# Patient Record
Sex: Female | Born: 1985
Health system: Southern US, Community
[De-identification: ages and names within clinical notes are randomized; demographics above are authoritative.]

## PROBLEM LIST (undated history)

## (undated) ENCOUNTER — Inpatient Hospital Stay: Payer: Self-pay

## (undated) DIAGNOSIS — F419 Anxiety disorder, unspecified: Secondary | ICD-10-CM

## (undated) DIAGNOSIS — F32A Depression, unspecified: Secondary | ICD-10-CM

## (undated) DIAGNOSIS — F329 Major depressive disorder, single episode, unspecified: Secondary | ICD-10-CM

## (undated) HISTORY — DX: Major depressive disorder, single episode, unspecified: F32.9

## (undated) HISTORY — DX: Anxiety disorder, unspecified: F41.9

## (undated) HISTORY — DX: Depression, unspecified: F32.A

## (undated) HISTORY — PX: WISDOM TOOTH EXTRACTION: SHX21

---

## 2008-04-30 ENCOUNTER — Observation Stay: Payer: Self-pay | Admitting: Obstetrics and Gynecology

## 2008-07-11 ENCOUNTER — Observation Stay: Payer: Self-pay

## 2008-07-13 ENCOUNTER — Inpatient Hospital Stay: Payer: Self-pay | Admitting: Obstetrics and Gynecology

## 2009-08-08 ENCOUNTER — Emergency Department: Payer: Self-pay | Admitting: Emergency Medicine

## 2010-12-25 ENCOUNTER — Emergency Department: Payer: Self-pay | Admitting: Emergency Medicine

## 2011-02-20 ENCOUNTER — Emergency Department: Payer: Self-pay | Admitting: Emergency Medicine

## 2011-03-22 ENCOUNTER — Ambulatory Visit: Payer: Self-pay

## 2012-12-10 ENCOUNTER — Emergency Department: Payer: Self-pay | Admitting: Unknown Physician Specialty

## 2012-12-10 LAB — BASIC METABOLIC PANEL
Anion Gap: 7 (ref 7–16)
BUN: 10 mg/dL (ref 7–18)
Chloride: 107 mmol/L (ref 98–107)
Co2: 26 mmol/L (ref 21–32)
EGFR (African American): >60
Glucose: 76 mg/dL (ref 65–99)
Osmolality: 277 (ref 275–301)
Potassium: 3.8 mmol/L (ref 3.5–5.1)
Sodium: 140 mmol/L (ref 136–145)

## 2012-12-10 LAB — CBC
HCT: 38 % (ref 35.0–47.0)
HGB: 13.2 g/dL (ref 12.0–16.0)
MCHC: 34.7 g/dL (ref 32.0–36.0)
RDW: 13.2 % (ref 11.5–14.5)

## 2012-12-10 LAB — TROPONIN I: Troponin-I: <0.02 ng/mL

## 2012-12-10 LAB — TSH: Thyroid Stimulating Horm: 0.876 u[IU]/mL

## 2014-05-13 ENCOUNTER — Emergency Department: Payer: Self-pay | Admitting: Emergency Medicine

## 2014-05-13 LAB — BASIC METABOLIC PANEL
Anion Gap: 7 (ref 7–16)
BUN: 8 mg/dL (ref 7–18)
CHLORIDE: 102 mmol/L (ref 98–107)
Calcium, Total: 8.5 mg/dL (ref 8.5–10.1)
Co2: 26 mmol/L (ref 21–32)
Creatinine: 0.79 mg/dL (ref 0.60–1.30)
EGFR (Non-African Amer.): >60
Glucose: 93 mg/dL (ref 65–99)
OSMOLALITY: 268 (ref 275–301)
POTASSIUM: 3.5 mmol/L (ref 3.5–5.1)
Sodium: 135 mmol/L — ABNORMAL LOW (ref 136–145)

## 2014-05-13 LAB — CBC WITH DIFFERENTIAL/PLATELET
BASOS ABS: 0 10*3/uL (ref 0.0–0.1)
Basophil %: 0.2 %
Eosinophil #: 0 10*3/uL (ref 0.0–0.7)
Eosinophil %: 0 %
HCT: 41.6 % (ref 35.0–47.0)
HGB: 13.7 g/dL (ref 12.0–16.0)
LYMPHS ABS: 1 10*3/uL (ref 1.0–3.6)
LYMPHS PCT: 6 %
MCH: 30.6 pg (ref 26.0–34.0)
MCHC: 33 g/dL (ref 32.0–36.0)
MCV: 93 fL (ref 80–100)
Monocyte #: 0.9 x10 3/mm (ref 0.2–0.9)
Monocyte %: 4.9 %
Neutrophil #: 15.5 10*3/uL — ABNORMAL HIGH (ref 1.4–6.5)
Neutrophil %: 88.9 %
PLATELETS: 232 10*3/uL (ref 150–440)
RBC: 4.49 10*6/uL (ref 3.80–5.20)
RDW: 13.9 % (ref 11.5–14.5)
WBC: 17.4 10*3/uL — AB (ref 3.6–11.0)

## 2014-05-13 LAB — ED INFLUENZA
H1N1 flu by pcr: NOT DETECTED
INFLAPCR: NEGATIVE
Influenza B By PCR: NEGATIVE

## 2014-05-15 LAB — BETA STREP CULTURE(ARMC)

## 2014-06-05 NOTE — L&D Delivery Note (Signed)
Delivery Note At 1:42 AM a viable female was delivered via Vaginal, Spontaneous Delivery (Presentation: LOA).  APGAR: , ; weight  .   Placenta status: Intact, Spontaneous.  Cord: 3 vessels, with the following complications: none apparent.  Cord pH: not collected  Anesthesia: Epidural  Episiotomy: None Lacerations:  none Suture Repair: none Est. Blood Loss (mL):  Zero (really)  Mom to postpartum.  Baby to Couplet care / Skin to Skin.  Called to room and fetal head was at the perineum.  Controlled pushing propelled baby through crowning and delivery.  Baby was immediately placed on mom's chest and attended to by pediatric team.  Cord clamping was delayed for 60sec and then cord was doubly clamped and FOB cut the cord.  Placenta then spontaneously delivered.  There was no blood loss during delivery, and minimal placental blood.  No lacerations/repairs.  We sang Happy [place]day to [place] while he and mom bonded skin-to-skin.  Maely Clements C 04/19/2015, 2:03 AM

## 2014-09-02 LAB — OB RESULTS CONSOLE VARICELLA ZOSTER ANTIBODY, IGG: VARICELLA IGG: IMMUNE

## 2014-09-02 LAB — OB RESULTS CONSOLE RUBELLA ANTIBODY, IGM: RUBELLA: IMMUNE

## 2014-09-02 LAB — OB RESULTS CONSOLE HIV ANTIBODY (ROUTINE TESTING): HIV: NONREACTIVE

## 2014-09-02 LAB — OB RESULTS CONSOLE ANTIBODY SCREEN: ANTIBODY SCREEN: NEGATIVE

## 2014-09-02 LAB — OB RESULTS CONSOLE HEPATITIS B SURFACE ANTIGEN: Hepatitis B Surface Ag: NEGATIVE

## 2014-11-24 ENCOUNTER — Encounter: Payer: Self-pay | Admitting: Emergency Medicine

## 2014-11-24 ENCOUNTER — Emergency Department
Admission: EM | Admit: 2014-11-24 | Discharge: 2014-11-24 | Disposition: A | Discharge status: Home / Self Care | Payer: 59 | Attending: Emergency Medicine | Admitting: Emergency Medicine

## 2014-11-24 ENCOUNTER — Emergency Department: Payer: 59

## 2014-11-24 DIAGNOSIS — O200 Threatened abortion: Secondary | ICD-10-CM | POA: Diagnosis not present

## 2014-11-24 DIAGNOSIS — Z3A18 18 weeks gestation of pregnancy: Secondary | ICD-10-CM | POA: Diagnosis not present

## 2014-11-24 DIAGNOSIS — O209 Hemorrhage in early pregnancy, unspecified: Secondary | ICD-10-CM | POA: Diagnosis present

## 2014-11-24 LAB — ABO/RH
ABO/RH(D): A POS
ABO/RH(D): A POS

## 2014-11-24 LAB — HCG, QUANTITATIVE, PREGNANCY: HCG, BETA CHAIN, QUANT, S: 23286 m[IU]/mL — AB (ref <5)

## 2014-11-24 LAB — CBC
HCT: 36.8 % (ref 35.0–47.0)
HEMOGLOBIN: 12.1 g/dL (ref 12.0–16.0)
MCH: 29.6 pg (ref 26.0–34.0)
MCHC: 33 g/dL (ref 32.0–36.0)
MCV: 89.8 fL (ref 80.0–100.0)
Platelets: 245 10*3/uL (ref 150–440)
RBC: 4.1 MIL/uL (ref 3.80–5.20)
RDW: 14.5 % (ref 11.5–14.5)
WBC: 11.9 10*3/uL — ABNORMAL HIGH (ref 3.6–11.0)

## 2014-11-24 NOTE — ED Notes (Signed)
Pt to ed with c/o large amount of vaginal bleeding,  Pt states she is approx 18 weeks OB.  Pt reports mild cramping assoc with bleeding.

## 2014-11-24 NOTE — ED Provider Notes (Signed)
[facility] Emergency Department Provider Note  Time seen: 2:32 PM  I have reviewed the triage vital signs and the nursing notes.   HISTORY  Chief Complaint Vaginal Bleeding    HPI Brenda Cohen is a 29 y.o. female with no known medical problems, currently [redacted] weeks pregnant presents the emergency department with some left-sided abdominal pain, and vaginal bleeding. According to the patient she was seen by her OB this morning, had a mild amount of left-sided abdominal pain at that time. She states after leaving the OB she was attempting to have a bowel movement and she was straining, she looked in the toilet and noticed blood in the toilet. Patient states she is sure it is vaginal bleeding and not rectal bleeding. This is the patient's third pregnancy. She states mild to moderate left-sided abdominal pain currently, she says the bleeding has largely resolved at this time. Somewhat worse with movement.     History reviewed. No pertinent past medical history.  There are no active problems to display for this patient.   History reviewed. No pertinent past surgical history.  No current outpatient prescriptions on file.  Allergies Review of patient's allergies indicates no known allergies.  No family history on file.  Social History History  Substance Use Topics  . Smoking status: Never Smoker   . Smokeless tobacco: Not on file  . Alcohol Use: Yes    Review of Systems Constitutional: Negative for fever. Cardiovascular: Negative for chest pain. Respiratory: Negative for shortness of breath. Gastrointestinal: Left lower abdominal pain. Negative for nausea/vomiting/diarrhea. Genitourinary: Negative for dysuria. Positive for vaginal bleeding. Musculoskeletal: Negative for back pain. 10-point ROS otherwise negative.  ____________________________________________   PHYSICAL EXAM:  VITAL SIGNS: ED Triage Vitals  Enc Vitals Group     BP 11/24/14  1216 109/70 mmHg     Pulse Rate 11/24/14 1216 96     Resp 11/24/14 1216 18     Temp 11/24/14 1216 98.3 F (36.8 C)     Temp Source 11/24/14 1216 Oral     SpO2 11/24/14 1216 97 %     Weight 11/24/14 1216 137 lb (62.143 kg)     Height 11/24/14 1216  (1.549 m)     Head Cir --      Peak Flow --      Pain Score 11/24/14 1217 1     Pain Loc --      Pain Edu? --      Excl. in GC? --     Constitutional: Alert and oriented. Well appearing  ENT   Mouth/Throat: Mucous membranes are moist. Cardiovascular: Normal rate, regular rhythm. No murmur Respiratory: Normal respiratory effort without tachypnea nor retractions. Breath sounds are clear  Gastrointestinal: Soft, mild left tenderness palpation. No rebound or guarding. Musculoskeletal: Nontender with normal range of motion in all extremities Neurologic:  Normal speech and language. No gross focal neurologic deficits Skin:  Skin is warm, dry and intact.  Psychiatric: Mood and affect are normal. Speech and behavior are normal.   ____________________________________________    RADIOLOGY  Ultrasound shows a single IUP, well-appearing.  ____________________________________________   INITIAL IMPRESSION / ASSESSMENT AND PLAN / ED COURSE  Pertinent labs & imaging results that were available during my care of the patient were reviewed by me and considered in my medical decision making (see chart for details).  Patient states he makes pregnant, with mild left-sided abdominal pain and moderate vaginal bleeding which is largely resolved at this time. We  will check labs, proceed with ultrasound, and pelvic exam to further evaluate. Ultrasound and labs show normal results. We will discharge the patient home with OB follow-up.  ____________________________________________   FINAL CLINICAL IMPRESSION(S) / ED DIAGNOSES  Threatened abortion   Minna Antis, MD 11/24/14 1525

## 2014-11-24 NOTE — Discharge Instructions (Signed)

## 2014-12-08 ENCOUNTER — Ambulatory Visit (INDEPENDENT_AMBULATORY_CARE_PROVIDER_SITE_OTHER): Payer: 59 | Admitting: Psychiatry

## 2014-12-08 ENCOUNTER — Encounter: Payer: Self-pay | Admitting: Psychiatry

## 2014-12-08 ENCOUNTER — Other Ambulatory Visit: Payer: Self-pay

## 2014-12-08 VITALS — BP 108/58 | HR 84 | Temp 98.5°F | Ht 61.0 in | Wt 136.4 lb

## 2014-12-08 DIAGNOSIS — F330 Major depressive disorder, recurrent, mild: Secondary | ICD-10-CM | POA: Diagnosis not present

## 2014-12-08 MED ORDER — SERTRALINE HCL 25 MG PO TABS: 25.0000 mg | ORAL_TABLET | Freq: Every day | ORAL | Status: DC

## 2014-12-08 NOTE — Progress Notes (Signed)
Psychiatric Initial Adult Assessment   Patient Identification: Brenda Cohen MRN:  [id number] Date of Evaluation:  12/08/2014 Referral Source[facility]  Chief Complaint:   Chief Complaint    Depression; Anxiety     Visit Diagnosis:    ICD-9-CM ICD-10-CM   1. Major depressive disorder, recurrent episode, mild 296.31 F33.0    Diagnosis:  There are no active problems to display for this patient.  History of Present Illness:    Pt is a 29 yo white female who is [redacted] weeks pregnant with her third baby presented for initial assessment. She has 6 and 7 yo boys at home.. She lives with her friend and had to leave her other place and her friend opened her bedroom apartment for her and her 2 boys. She stated that all her children are from different fathers. Patient reported that she has long history of depressive symptoms and she was referred by [facility]. She reported that she feels that she currently has depression which is constant and is not getting worse. She stated that she feels like falling in a hole, angry some days, and she sleeps for some days. She stated that she is depressed due to life situation, probably since she is 29 years old.  She stated that she is not talking to the father to her unborn child, as he wanted the child and now he is running away.  She stated that her two boys are good but she snaps at them and she looses temper quickly. Patient currently denied using any drugs but reported that she drinks occasionally. She denied having any suicidal homicidal ideations or plans.  She stated that she is not taking depression medications.    Elements:  Location:  depression. Associated Signs/Symptoms: Depression Symptoms:  depressed mood, fatigue, anxiety, loss of energy/fatigue, (Hypo) Manic Symptoms:  Distractibility, Impulsivity, Irritable Mood, Anxiety Symptoms:  Excessive Worry, Psychotic Symptoms:  denied PTSD Symptoms: NA  Past Medical History:   Past Medical History  Diagnosis Date  . Anxiety   . Depression     Past Surgical History  Procedure Laterality Date  . Wisdom tooth extraction     Family History:  Family History  Problem Relation Age of Onset  . Drug abuse Mother   . Hyperlipidemia Father   . Hypertension Father   . Depression Father   . Migraines Sister   . Bipolar disorder Paternal Uncle   . Schizophrenia Paternal Uncle   . OCD Paternal Uncle    Social History:   History   Social History  . Marital Status: Single    Spouse Name: N/A  . Number of Children: N/A  . Years of Education: N/A   Social History Main Topics  . Smoking status: Never Smoker   . Smokeless tobacco: Not on file  . Alcohol Use: 0.6 oz/week    1 Glasses of wine per week  . Drug Use: Yes    Special: Marijuana     Comment: not since she pregnant  . Sexual Activity: No   Other Topics Concern  . None   Social History Narrative   Additional Social History:  Living with friend. Never married. Has 2 boys and is currently pregnant. She works in [employer] full time. Family lives in [place] and she has good relationship with them.  Had a DWI last year in July.    Musculoskeletal: Strength & Muscle Tone: within normal limits Gait & Station: normal Patient leans: N/A  Psychiatric Specialty Exam: HPI  Review of Systems  Constitutional: Negative for weight loss.  HENT: Negative for nosebleeds and tinnitus.   Eyes: Negative for pain.  Respiratory: Negative for hemoptysis.   Cardiovascular: Negative for palpitations.  Gastrointestinal: Negative for vomiting.  Genitourinary: Negative for urgency.  Musculoskeletal: Negative for neck pain.  Neurological: Negative for speech change.  Psychiatric/Behavioral: Positive for depression. Negative for hallucinations. The patient has insomnia.     Blood pressure 108/58, pulse 84, temperature 98.5 F (36.9 C), temperature source Tympanic, height  (1.549 m), weight 136 lb 6.4 oz  (61.871 kg), last menstrual period 07/16/2014, SpO2 95 %.Body mass index is 25.79 kg/(m^2).  General Appearance: Casual  Eye Contact:  Fair  Speech:  Pressured  Volume:  Normal  Mood:  Anxious  Affect:  Congruent  Thought Process:  Coherent  Orientation:  NA  Thought Content:  WDL  Suicidal Thoughts:  No  Homicidal Thoughts:  No  Memory:  NA  Judgement:  Fair  Insight:  Fair  Psychomotor Activity:  Normal  Concentration:  Fair  Recall:  [employer] of Knowledge:Fair  Language: Fair  Akathisia:  No  Handed:  Right  AIMS (if indicated):  none  Assets:  Manufacturing systems engineer Physical Health Social Support Talents/Skills  ADL's:  Intact  Cognition: WNL  Sleep:  6-7    Is the patient at risk to self?  No. Has the patient been a risk to self in the past 6 months?  No. Has the patient been a risk to self within the distant past?  No. Is the patient a risk to others?  No. Has the patient been a risk to others in the past 6 months?  No. Has the patient been a risk to others within the distant past?  No.  Allergies:  No Known Allergies Current Medications: Current Outpatient Prescriptions  Medication Sig Dispense Refill  . Prenat-FeFum-FePo-FA-Omega 3 (TARON-C DHA) 53.5-38-1 MG CAPS Take 1 capsule by mouth daily.  1   No current facility-administered medications for this visit.    Previous Psychotropic Medications:  none  Substance Abuse History in the last 12 months:  Yes.    Recent use of wine over the weekend.   Consequences of Substance Abuse: NA  Medical Decision Making:  Review of Psycho-Social Stressors (1)  Treatment Plan Summary: Medication management  Discussed with patient about the medications treatment risks benefits and alternatives She agreed to a trial of Zoloft 25 mg daily and discussed with her about the adverse effects in detail and she demonstrated understanding She will follow up in 1 month or earlier She will also be referred to  therapy    Brandy Hale 7/5/20163:07 PM

## 2014-12-16 ENCOUNTER — Ambulatory Visit (INDEPENDENT_AMBULATORY_CARE_PROVIDER_SITE_OTHER): Payer: 59 | Admitting: Licensed Clinical Social Worker

## 2014-12-16 DIAGNOSIS — F330 Major depressive disorder, recurrent, mild: Secondary | ICD-10-CM
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
## 2014-12-17 DIAGNOSIS — F330 Major depressive disorder, recurrent, mild: Secondary | ICD-10-CM | POA: Insufficient documentation

## 2014-12-24 ENCOUNTER — Ambulatory Visit (INDEPENDENT_AMBULATORY_CARE_PROVIDER_SITE_OTHER): Payer: 59 | Admitting: Licensed Clinical Social Worker

## 2014-12-24 DIAGNOSIS — F330 Major depressive disorder, recurrent, mild: Secondary | ICD-10-CM
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
## 2015-01-05 ENCOUNTER — Ambulatory Visit (INDEPENDENT_AMBULATORY_CARE_PROVIDER_SITE_OTHER): Payer: 59 | Admitting: Psychiatry

## 2015-01-05 ENCOUNTER — Encounter: Payer: Self-pay | Admitting: Psychiatry

## 2015-01-05 VITALS — BP 104/68 | HR 80 | Temp 98.1°F | Ht 61.0 in | Wt 141.0 lb

## 2015-01-05 DIAGNOSIS — F330 Major depressive disorder, recurrent, mild: Secondary | ICD-10-CM | POA: Diagnosis not present

## 2015-01-05 MED ORDER — SERTRALINE HCL 50 MG PO TABS: 50.0000 mg | ORAL_TABLET | Freq: Every day | ORAL | Status: DC

## 2015-01-05 NOTE — Progress Notes (Signed)
Psychiatric Progress Note   Patient Identification: Brenda Cohen MRN:  [id number] Date of Evaluation:  01/05/2015 Referral Source: [facility]  Chief Complaint:   Chief Complaint    Anxiety; Depression; Follow-up     Visit Diagnosis:    ICD-9-CM ICD-10-CM   1. MDD (major depressive disorder), recurrent episode, mild 296.31 F33.0    Diagnosis:   Patient Active Problem List   Diagnosis Date Noted  . Major depressive disorder, recurrent episode, mild [F33.0] 12/17/2014   History of Present Illness:    Pt is a 29 yo white female who is [redacted] weeks pregnant with her third baby presented for follow up. Patient stated that she has been following with Felecia Jan on a regular basis for her therapy appointments. She reported that she has started improving on her symptoms and her depressive symptoms are improving. Some days she will feel more tired due to her pregnancy. Reported that her children are also improving and they are helping her. Her depressive symptoms have been gradually improving and she has more energy. She reported that she is looking forward to have her third son in November. She currently denied having any adverse effects of the sertraline. She currently denied having any suicidal ideations or plans. She reported that she is not depressed and is trying to do her work and is not planning to relocate close to her family members. Her friends are also very supportive. She denied having any suicidal ideations or plans. She denied having any perceptual disturbances.     Past Medical History:  Past Medical History  Diagnosis Date  . Anxiety   . Depression     Past Surgical History  Procedure Laterality Date  . Wisdom tooth extraction     Family History:  Family History  Problem Relation Age of Onset  . Drug abuse Mother   . Hyperlipidemia Father   . Hypertension Father   . Depression Father   . Migraines Sister   . Bipolar disorder Paternal Uncle   . Schizophrenia  Paternal Uncle   . OCD Paternal Uncle    Social History:   History   Social History  . Marital Status: Single    Spouse Name: N/A  . Number of Children: N/A  . Years of Education: N/A   Social History Main Topics  . Smoking status: Never Smoker   . Smokeless tobacco: Never Used  . Alcohol Use: 0.6 oz/week    1 Glasses of wine per week  . Drug Use: Yes    Special: Marijuana     Comment: not since she pregnant  . Sexual Activity: No   Other Topics Concern  . None   Social History Narrative   Additional Social History:  Living with friend. Never married. Has 2 boys and is currently pregnant. She works in [employer] full time. Family lives in [place] and she has good relationship with them.  Had a DWI last year in July.    Musculoskeletal: Strength & Muscle Tone: within normal limits Gait & Station: normal Patient leans: N/A  Psychiatric Specialty Exam: Anxiety Symptoms include insomnia. Patient reports no palpitations.      Review of Systems  Constitutional: Negative for weight loss.  HENT: Negative for nosebleeds and tinnitus.   Eyes: Negative for pain.  Respiratory: Negative for hemoptysis.   Cardiovascular: Negative for palpitations.  Gastrointestinal: Negative for vomiting.  Genitourinary: Negative for urgency.  Musculoskeletal: Negative for neck pain.  Neurological: Negative for speech change.  Psychiatric/Behavioral: Positive  for depression. Negative for hallucinations. The patient has insomnia.     Blood pressure 104/68, pulse 80, temperature 98.1 F (36.7 C), temperature source Tympanic, height 5\' 1"  (1.549 m), weight 141 lb (63.957 kg), last menstrual period 07/16/2014, SpO2 100 %.Body mass index is 26.66 kg/(m^2).  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  No. Has the patient been a risk to self in the past 6 months?  No. Has the patient been a risk to self within the distant past?  No. Is the patient a risk to others?  No. Has the patient been a risk to others in the past 6 months?  No. Has the patient been a risk to others within the distant past?  No.  Allergies:  No Known Allergies Current Medications: Current Outpatient Prescriptions  Medication Sig Dispense Refill  . Prenat-FeFum-FePo-FA-Omega 3 (TARON-C DHA) 53.5-38-1 MG CAPS Take 1 capsule by mouth daily.  1  . sertraline (ZOLOFT) 25 MG tablet Take 1 tablet (25 mg total) by mouth daily. 30 tablet 0   No current facility-administered medications for this visit.       Medical Decision Making:  Review of Psycho-Social Stressors (1)  Treatment Plan Summary: Medication management  Discussed with patient about the medications treatment risks benefits and alternatives She agreed to a  titrating the dose of sertraline 50 mg daily. She will also continue with therapy with Felecia Jan on a regular basis She will follow up in 1 month or earlier    More than 50% of the time spent in psychoeducation, counseling and coordination of care.    This note was generated in part or whole with voice recognition software. Voice regonition is usually quite accurate but there are transcription errors that can and very often do occur. I apologize for any typographical errors that were not detected and corrected.     Brandy Hale 8/2/20163:06 PM

## 2015-01-06 ENCOUNTER — Ambulatory Visit (INDEPENDENT_AMBULATORY_CARE_PROVIDER_SITE_OTHER): Payer: 59 | Admitting: Licensed Clinical Social Worker

## 2015-01-06 DIAGNOSIS — F411 Generalized anxiety disorder: Secondary | ICD-10-CM

## 2015-01-06 DIAGNOSIS — F330 Major depressive disorder, recurrent, mild: Secondary | ICD-10-CM
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
## 2015-01-08 DIAGNOSIS — F411 Generalized anxiety disorder: Secondary | ICD-10-CM | POA: Insufficient documentation

## 2015-01-08 DIAGNOSIS — F330 Major depressive disorder, recurrent, mild: Secondary | ICD-10-CM | POA: Insufficient documentation

## 2015-01-26 LAB — OB RESULTS CONSOLE HIV ANTIBODY (ROUTINE TESTING): HIV: NONREACTIVE

## 2015-01-26 LAB — OB RESULTS CONSOLE RPR: RPR: NONREACTIVE

## 2015-02-04 ENCOUNTER — Encounter: Payer: Self-pay | Admitting: Psychiatry

## 2015-02-04 ENCOUNTER — Ambulatory Visit (INDEPENDENT_AMBULATORY_CARE_PROVIDER_SITE_OTHER): Payer: 59 | Admitting: Psychiatry

## 2015-02-04 VITALS — BP 118/84 | HR 102 | Temp 98.3°F | Ht 61.0 in | Wt 150.2 lb

## 2015-02-04 DIAGNOSIS — F330 Major depressive disorder, recurrent, mild: Secondary | ICD-10-CM

## 2015-02-04 MED ORDER — SERTRALINE HCL 50 MG PO TABS: 50.0000 mg | ORAL_TABLET | Freq: Every day | ORAL | Status: DC

## 2015-02-04 NOTE — Progress Notes (Signed)
Psychiatric MD/NP/Progress NOTE  Patient Identification: Brenda Cohen MRN:  [id number] Date of Evaluation:  02/04/2015 Chief Complaint:   Chief Complaint    Follow-up     Visit Diagnosis:    ICD-9-CM ICD-10-CM   1. MDD (major depressive disorder), recurrent episode, mild 296.31 F33.0    Diagnosis:   Patient Active Problem List   Diagnosis Date Noted  . MDD (major depressive disorder), recurrent episode, mild [F33.0] 01/08/2015  . Anxiety state [F41.1] 01/08/2015  . Major depressive disorder, recurrent episode, mild [F33.0] 12/17/2014   History of Present Illness:    Pt is a 29 yo white female who is [redacted]  weeks pregnant with her third baby presented for follow up. Patient stated that she has started noticing improvement in her current symptoms. She reported that she has noticed that she has been feeling better since the dose of Zoloft was increased to 50 mg. Patient currently denied having any suicidal ideations or plans. She reported that her 35 and 30 year old sons are also helping her and they are very excited to hear that she is currently pregnant. She is also working full-time and feels tired by the end of the day. She is also having back pain and is unable to rest well at night. She has stopped taking the prenatal vitamins. Patient reported that she is looking forward to have her third son in November which is due in the middle of November. She has good relationship with her family members. She currently denied having any mood swings anger anxiety or paranoia.  She denied having any suicidal ideations or plans. She denied having any perceptual disturbances.     Past Medical History:  Past Medical History  Diagnosis Date  . Anxiety   . Depression     Past Surgical History  Procedure Laterality Date  . Wisdom tooth extraction     Family History:  Family History  Problem Relation Age of Onset  . Drug abuse Mother   . Hyperlipidemia Father   . Hypertension Father   .  Depression Father   . Migraines Sister   . Bipolar disorder Paternal Uncle   . Schizophrenia Paternal Uncle   . OCD Paternal Uncle    Social History:   Social History   Social History  . Marital Status: Single    Spouse Name: N/A  . Number of Children: N/A  . Years of Education: N/A   Social History Main Topics  . Smoking status: Never Smoker   . Smokeless tobacco: Never Used  . Alcohol Use: 0.6 oz/week    1 Glasses of wine per week  . Drug Use: Yes    Special: Marijuana     Comment: not since she pregnant  . Sexual Activity: No   Other Topics Concern  . None   Social History Narrative   Additional Social History:  Living with friend. Never married. Has 2 boys and is currently pregnant. She works in [employer] full time. Family lives in [place] and she has good relationship with them.  Had a DWI last year in July.    Musculoskeletal: Strength & Muscle Tone: within normal limits Gait & Station: normal Patient leans: N/A  Psychiatric Specialty Exam: Anxiety Symptoms include insomnia. Patient reports no palpitations.      Review of Systems  Constitutional: Negative for weight loss.  HENT: Negative for nosebleeds and tinnitus.   Eyes: Negative for pain.  Respiratory: Negative for hemoptysis.   Cardiovascular: Negative for palpitations.  Gastrointestinal: Negative for  vomiting.  Genitourinary: Negative for urgency.  Musculoskeletal: Negative for neck pain.  Neurological: Negative for speech change.  Psychiatric/Behavioral: Positive for depression. Negative for hallucinations. The patient has insomnia.   All other systems reviewed and are negative.   Blood pressure 118/84, pulse 102, temperature 98.3 F (36.8 C), temperature source Tympanic, height  (1.549 m), weight 150 lb 3.2 oz (68.13 kg), last menstrual period 07/16/2014, SpO2 99 %.Body mass index is 28.39 kg/(m^2).  General Appearance: Casual  Eye Contact:  Fair  Speech:  Pressured  Volume:  Normal   Mood:  Anxious  Affect:  Congruent  Thought Process:  Coherent  Orientation:  NA  Thought Content:  WDL  Suicidal Thoughts:  No  Homicidal Thoughts:  No  Memory:  NA  Judgement:  Fair  Insight:  Fair  Psychomotor Activity:  Normal  Concentration:  Fair  Recall:  [employer] of Knowledge:Fair  Language: Fair  Akathisia:  No  Handed:  Right  AIMS (if indicated):  none  Assets:  Manufacturing systems engineer Physical Health Social Support Talents/Skills  ADL's:  Intact  Cognition: WNL  Sleep:  6-7    Is the patient at risk to self?  No. Has the patient been a risk to self in the past 6 months?  No. Has the patient been a risk to self within the distant past?  No. Is the patient a risk to others?  No. Has the patient been a risk to others in the past 6 months?  No. Has the patient been a risk to others within the distant past?  No.  Allergies:  No Known Allergies Current Medications: Current Outpatient Prescriptions  Medication Sig Dispense Refill  . sertraline (ZOLOFT) 50 MG tablet Take 1 tablet (50 mg total) by mouth daily. 30 tablet 1  . Prenat-FeFum-FePo-FA-Omega 3 (TARON-C DHA) 53.5-38-1 MG CAPS Take 1 capsule by mouth daily.  1   No current facility-administered medications for this visit.       Medical Decision Making:  Review of Psycho-Social Stressors (1)  Treatment Plan Summary: Medication management  Discussed with patient about the medications treatment risks benefits and alternatives Continue with sertraline 50 mg daily.  She will also continue with therapy with Felecia Jan on a regular basis  She will follow up in 2 months     More than 50% of the time spent in psychoeducation, counseling and coordination of care.    This note was generated in part or whole with voice recognition software. Voice regonition is usually quite accurate but there are transcription errors that can and very often do occur. I apologize for any typographical errors that were  not detected and corrected.     Brandy Hale 9/1/20163:04 PM

## 2015-02-10 ENCOUNTER — Ambulatory Visit (INDEPENDENT_AMBULATORY_CARE_PROVIDER_SITE_OTHER): Payer: 59 | Admitting: Licensed Clinical Social Worker

## 2015-02-10 DIAGNOSIS — F411 Generalized anxiety disorder: Secondary | ICD-10-CM

## 2015-02-10 DIAGNOSIS — F330 Major depressive disorder, recurrent, mild: Secondary | ICD-10-CM | POA: Diagnosis not present

## 2015-02-10 NOTE — Progress Notes (Signed)
THERAPIST PROGRESS NOTE  Session Time: 4:15 p.m. - 5:20 p.m.  Participation Level: Active  Behavioral Response: NeatAlertEuthymic  Type of Therapy: Individual Therapy  Treatment Goals addressed: Communication: assertiveness and Coping  Interventions: CBT, Solution Focused, Strength-based and Supportive  Summary: DODIE PARISI is a 29 y.o. female who presents with overall improvement in presenting symptoms of anxiety, depression and irritability.  She returns to OPT and in spite of ongoing stressors as a single mother and upcoming birth of third child, indicated that:  "Actually I've been pretty good but I've had moments where I was anxious and depressed."  She is now about [redacted] weeks pregnant and has had more positive contacts with the baby's father. He has indicated that he wants to take off time from work to be there with her and the baby.  At this time she has informed the baby's father that she does not want him in the delivery room with her and feels confident about her decision.  Libbi admits that she is keeping an open mind about this especially if the contacts with her ex continue to be positive stating "I don't want to rob him of an opportunity."  Living arrangements with her friend/co-worker have been good and on a positive note, she will be putting a deposit down on an apartment and with plans to be moved in to apartment by end of September with her two sons.  Client talked about a new relationship that she is in with a man whom she has known for several years but only recently started dating.  "We've made it official and I've met his mom."  She talked about the positive characteristics of boyfriend, Louie Casa, and that he has custody of his two sons and the blending of the families is going well.  The two have begun to talk about moving in together since he is living in the home with his parents.  Darionna admits that she has felt uncertain about boyfriend's intentions, especially with  her being pregnant, yet now believes that he is sincere and without pressure in terms of intimacy.  "The anxiety, irritability and depression are down. I have subtle moments but I get through it."  Denied outbursts with her children and voicing more confidence in recent changes in her style of discipline.  Client's ex-husband continues to be supportive and helpful with both sons, even though one is not biologically his son, and has already committed to keeping the children for a while during and after the birth of her baby who is due in November.  Recent contact with her first ex-husband who is now going through a divorce has been odd but amicable with him offering client positive emotional support about being a good mother.  Recent family chaos between her grand-mother, uncle and her father was discussed and Museum/gallery conservator spoke proudly of how she handled the situation by setting firm boundaries with certain family members and still maintaining a connection with them.  Several friends and her co-workers will give her baby showers and she denied concerns or needs for herself or the baby.  Lilou denied problems with flashbacks or other PTSD symptoms and expressed feeling more equipped with coping skills to move herself through any anxious or depressed thoughts.  Appropriate anxiety and worry voiced as she shared "I sometimes ask myself if I'm going to be able to do this with another child."  Has had concerns about the delivery.  These thoughts are fleeting and she is able to re-frame  these with more optimism about her support system and own abilities.   Suicidal/Homicidal: Negativewithout intent/plan  Therapist Response:   Ongoing supportive psychotherapy with insight and emotional support was provided to client as well as empowering her through highlighting her personal strengths.  Commended Jalexa on use of healthy boundary setting and avoidance of people pleasing behaviors.  Explored current functioning and  symptoms/coping and validated her feelings and thoughts associated with recent social situations and life experiences. Discussed management of boundary setting in all relationships and cautioned client about potential negative impact of self criticism and lack of firm boundary setting to protect her emotional and physical well-being.  Validated her recent handling of situation with the father of unborn child as well as her increased positive interactions with her sons.  Encouraged client to ask for what she needs.  Plan: Return again in 2 weeks. Continue to focus on client's functioning, access to resources and assist with referrals or collaboration with community resources and other healthcare providers PRN. Shakyra will continue expressing thoughts, needs and feelings and take an active role in creating a plan for success as she approaches motherhood for the third time.  Diagnosis: Recurrent Major Depressive Disorder, Mild   Anxiety State   Rule out PTSD  Miguel Dibble, LCSW 02/10/2015

## 2015-03-10 ENCOUNTER — Observation Stay
Admission: EM | Admit: 2015-03-10 | Discharge: 2015-03-10 | Disposition: A | Discharge status: Home / Self Care | Payer: 59 | Attending: Obstetrics & Gynecology | Admitting: Obstetrics & Gynecology

## 2015-03-10 DIAGNOSIS — R102 Pelvic and perineal pain: Secondary | ICD-10-CM | POA: Insufficient documentation

## 2015-03-10 DIAGNOSIS — M545 Low back pain: Secondary | ICD-10-CM | POA: Insufficient documentation

## 2015-03-10 DIAGNOSIS — O26893 Other specified pregnancy related conditions, third trimester: Principal | ICD-10-CM | POA: Insufficient documentation

## 2015-03-10 DIAGNOSIS — Z3A33 33 weeks gestation of pregnancy: Secondary | ICD-10-CM | POA: Insufficient documentation

## 2015-03-10 LAB — URINALYSIS COMPLETE WITH MICROSCOPIC (ARMC ONLY)
Bilirubin Urine: NEGATIVE
Glucose, UA: NEGATIVE mg/dL
HGB URINE DIPSTICK: NEGATIVE
KETONES UR: NEGATIVE mg/dL
Nitrite: NEGATIVE
PH: 6 (ref 5.0–8.0)
Protein, ur: NEGATIVE mg/dL
Specific Gravity, Urine: 1.009 (ref 1.005–1.030)

## 2015-03-10 NOTE — Discharge Summary (Signed)
Brenda Cohen is a 29 y.o. female. She is at [redacted]w[redacted]d gestation.  Indication: pelvic and back pressure  S: Resting comfortably. no CTX, no VB. Active fetal movement. She feels pressure in her low back and in her vagina/pelvis when she stands. O:  BP 93/56 mmHg  Pulse 89  Temp(Src) 98.1 F (36.7 C) (Oral)  Resp 16    Results for orders placed or performed during the hospital encounter of 03/10/15 (from the past 48 hour(s))  Urinalysis complete, with microscopic [facility] only)   Collection Time: 03/10/15  4:16 PM  Result Value Ref Range   Color, Urine YELLOW (A) YELLOW   APPearance HAZY (A) CLEAR   Glucose, UA NEGATIVE NEGATIVE mg/dL   Bilirubin Urine NEGATIVE NEGATIVE   Ketones, ur NEGATIVE NEGATIVE mg/dL   Specific Gravity, Urine 1.009 1.005 - 1.030   Hgb urine dipstick NEGATIVE NEGATIVE   pH 6.0 5.0 - 8.0   Protein, ur NEGATIVE NEGATIVE mg/dL   Nitrite NEGATIVE NEGATIVE   Leukocytes, UA 2+ (A) NEGATIVE   RBC / HPF 0-5 0 - 5 RBC/hpf   WBC, UA 6-30 0 - 5 WBC/hpf   Bacteria, UA RARE (A) NONE SEEN   Squamous Epithelial / LPF 6-30 (A) NONE SEEN   Mucous PRESENT      Gen: NAD, AAOx3      Abd: FNTTP      Ext: Non-tender, Nonedmeatous  Back:    FHT: 135 mod +accels no decels TOCO: quiet SVE: deferred   A/P:  16XW [id number] with pelvic girdle pain.   Labor: not present, no UTI  Recommend abdominal binder around pelvis, stretches, massage, and chiropractor for adjustment of pelvic girdle.  Fetal Wellbeing: Reassuring Cat 1 tracing.  D/c home stable, precautions reviewed, follow-up as scheduled.   ----- Ranae Plumber, MD Attending Obstetrician and Gynecologist [facility]

## 2015-03-10 NOTE — OB Triage Note (Signed)
Pt reports lower back pressure in the middle of her back starting this morning at 8AM accompanied by pressure at her pubic bone. Denies burning with urination and says she has had increased frequency of urination since third trimester.  Pt reports mucus-like discharge with "a little bit of blood" over the weekend, but none now.

## 2015-03-10 NOTE — Progress Notes (Signed)
Dr. Elesa Massed at bedside to discuss POC with patient and discharge instructions.  Pt verbalizes understanding.

## 2015-03-24 LAB — OB RESULTS CONSOLE GBS: STREP GROUP B AG: NEGATIVE

## 2015-03-29 ENCOUNTER — Observation Stay
Admission: EM | Admit: 2015-03-29 | Discharge: 2015-03-29 | Disposition: A | Discharge status: Home / Self Care | Payer: 59 | Attending: Obstetrics & Gynecology | Admitting: Obstetrics & Gynecology

## 2015-03-29 DIAGNOSIS — Z3A36 36 weeks gestation of pregnancy: Secondary | ICD-10-CM | POA: Diagnosis not present

## 2015-03-29 DIAGNOSIS — O479 False labor, unspecified: Secondary | ICD-10-CM | POA: Diagnosis present

## 2015-03-29 LAB — URINALYSIS COMPLETE WITH MICROSCOPIC (ARMC ONLY)
Bacteria, UA: NONE SEEN
Bilirubin Urine: NEGATIVE
GLUCOSE, UA: NEGATIVE mg/dL
HGB URINE DIPSTICK: NEGATIVE
Ketones, ur: NEGATIVE mg/dL
NITRITE: NEGATIVE
PH: 7 (ref 5.0–8.0)
Protein, ur: NEGATIVE mg/dL
SPECIFIC GRAVITY, URINE: 1.002 — AB (ref 1.005–1.030)

## 2015-03-29 MED ORDER — ONDANSETRON HCL 4 MG/2ML IJ SOLN: 4.0000 mg | Freq: Four times a day (QID) | INTRAMUSCULAR | Status: DC | PRN

## 2015-03-29 MED ORDER — ACETAMINOPHEN 325 MG PO TABS: 650.0000 mg | ORAL_TABLET | ORAL | Status: DC | PRN

## 2015-03-29 NOTE — OB Triage Note (Signed)
Dr Tiburcio PeaHarris on unit and reviewed UA results. Order received may discharge pt home.

## 2015-03-29 NOTE — Final Progress Note (Addendum)
Physician Final Progress Note  Patient ID: Brenda Cohen MRN: [id number] DOB/AGE: 1986-01-26 29 y.o.  Admit date: 03/29/2015 Admitting provider: Nadara Mustardobert P Emmilynn Marut, MD Discharge date: 03/29/2015   Admission Diagnoses: Abdominal pain, 36 4/[redacted] weeks EGA  Discharge Diagnoses:  Active Problems:   Irregular contractions   Consults: None  Significant Findings/ Diagnostic Studies: labs: UA neg Irreg ctxs on toco SVE- neg nitrazine; 1/50/-3 Extr no edema  Procedures: NST R A NST procedure was performed with FHR monitoring and a normal baseline established, appropriate time of 20-40 minutes of evaluation, and accels >2 seen w 15x15 characteristics.  Results show a REACTIVE NST.   Discharge Condition: good  Disposition: 01-Home or Self Care  Diet: Regular diet  Discharge Activity: Activity as tolerated     Medication List    ASK your doctor about these medications        sertraline 50 MG tablet  Commonly known as:  ZOLOFT  Take 1 tablet (50 mg total) by mouth daily.     TARON-C DHA 53.5-38-1 MG Caps  Take 1 capsule by mouth daily.         Total time spent taking care of this patient: 15 minutes  Signed: Letitia LibraHARRIS,Marissia Blackham PAUL 03/29/2015, 3:45 PM

## 2015-03-29 NOTE — OB Triage Note (Signed)
Dr Tiburcio PeaHarris called to unit after page and notified of pts arrival with c/o vaginal pressure and sharp pains in vaginal area when ambulating, pt felt a pop in lower abd this AM, pt denies leaking fluid, vag bleeding. Nitrazine neg and SVE 1/50/-2/posterior, FHR reactive, occ ctxs, pt c/o burning with voiding. Dr Tiburcio PeaHarris states he is on his way in to put in orders for UA.

## 2015-03-29 NOTE — OB Triage Note (Signed)
Discharge instructions given to pt. Verbalized understanding. Copy provided. Pt discharged off unit in stable condition ambulatory by herself.

## 2015-03-29 NOTE — OB Triage Note (Signed)
Received pt to OBS 3 via WC from home with c/o vaginal pressure and sharp pains going through vaginal area since yesterday. Pt states she felt a pop earlier this AM in her right lower abd but denies leaking any fluid. Pt denies any vaginal bleeding and states + fetal movment. Pt changed into gown and EFM applied.

## 2015-04-06 ENCOUNTER — Encounter: Payer: Self-pay | Admitting: Psychiatry

## 2015-04-06 ENCOUNTER — Ambulatory Visit (INDEPENDENT_AMBULATORY_CARE_PROVIDER_SITE_OTHER): Payer: 59 | Admitting: Psychiatry

## 2015-04-06 VITALS — BP 122/78 | HR 88 | Temp 98.7°F | Ht 61.0 in | Wt 158.4 lb

## 2015-04-06 DIAGNOSIS — F330 Major depressive disorder, recurrent, mild: Secondary | ICD-10-CM

## 2015-04-06 MED ORDER — SERTRALINE HCL 50 MG PO TABS: 50.0000 mg | ORAL_TABLET | Freq: Every day | ORAL | Status: DC

## 2015-04-06 NOTE — Progress Notes (Signed)
Psychiatric MD/NP/Progress NOTE  Patient Identification: Brenda Cohen MRN:  [id number] Date of Evaluation:  04/06/2015 Chief Complaint:   Chief Complaint    Follow-up; Medication Refill     Visit Diagnosis:    ICD-9-CM ICD-10-CM   1. MDD (major depressive disorder), recurrent episode, mild (HCC) 296.31 F33.0    Diagnosis:   Patient Active Problem List   Diagnosis Date Noted  . Irregular contractions [O62.2] 03/29/2015  . Indication for care in labor or delivery [O75.9] 03/10/2015  . MDD (major depressive disorder), recurrent episode, mild (HCC) [F33.0] 01/08/2015  . Anxiety state [F41.1] 01/08/2015  . Major depressive disorder, recurrent episode, mild (HCC) [F33.0] 12/17/2014   History of Present Illness:    Pt is a 29 yo white female who is pregnant with her third baby presented for follow up. Patient stated that she is near her due date and is expected to deliver on November 17. Patient reported that she is doing well on her medications and has been taking Zoloft as prescribed. She reported that she is very excited about her new baby. She has good relationship with her other 2 sons. Patient is expecting to have anger be poor. Patient reported that she is uncomfortable sleeping at night and has to toss and turn. She reported that she has enough support of her ex boyfriend and her voice father as well. She beat taking at least 6 weeks off from her work at [employer] after the delivery of her child. Patient reported that she does not have any more swings anger anxiety or paranoia. She feels tired by the end of the day but is trying to walk more so she can relieve  the back pain. Patient currently denied having any suicidal ideations or plans. She denied having any perceptual disturbances. She appeared cooperative during the interview.   Past Medical History:  Past Medical History  Diagnosis Date  . Anxiety   . Depression     Past Surgical History  Procedure Laterality Date  .  Wisdom tooth extraction     Family History:  Family History  Problem Relation Age of Onset  . Drug abuse Mother   . Hyperlipidemia Father   . Hypertension Father   . Depression Father   . Migraines Sister   . Bipolar disorder Paternal Uncle   . Schizophrenia Paternal Uncle   . OCD Paternal Uncle    Social History:   Social History   Social History  . Marital Status: Single    Spouse Name: N/A  . Number of Children: N/A  . Years of Education: N/A   Social History Main Topics  . Smoking status: Never Smoker   . Smokeless tobacco: Never Used  . Alcohol Use: 0.6 oz/week    1 Glasses of wine per week  . Drug Use: Yes    Special: Marijuana     Comment: not since she pregnant  . Sexual Activity: No   Other Topics Concern  . None   Social History Narrative   Additional Social History:  Living with friend. Never married. Has 2 boys and is currently pregnant. She works in [employer] full time. Family lives in [place] and she has good relationship with them.     Musculoskeletal: Strength & Muscle Tone: within normal limits Gait & Station: normal Patient leans: N/A  Psychiatric Specialty Exam: Anxiety Symptoms include insomnia. Patient reports no palpitations.      Review of Systems  Constitutional: Negative for weight loss.  HENT: Negative for  nosebleeds and tinnitus.   Eyes: Negative for pain.  Respiratory: Negative for hemoptysis.   Cardiovascular: Negative for palpitations.  Gastrointestinal: Negative for vomiting.  Genitourinary: Negative for urgency.  Musculoskeletal: Negative for neck pain.  Neurological: Negative for speech change.  Psychiatric/Behavioral: Positive for depression. Negative for hallucinations. The patient has insomnia.   All other systems reviewed and are negative.   Blood pressure 122/78, pulse 88, temperature 98.7 F (37.1 C), temperature source Tympanic, height  (1.549 m), weight 158 lb 6.4 oz (71.85 kg), last menstrual period  07/16/2014, SpO2 99 %.Body mass index is 29.94 kg/(m^2).  General Appearance: Casual  Eye Contact:  Fair  Speech:  Pressured  Volume:  Normal  Mood:  Anxious  Affect:  Congruent  Thought Process:  Coherent  Orientation:  NA  Thought Content:  WDL  Suicidal Thoughts:  No  Homicidal Thoughts:  No  Memory:  NA  Judgement:  Fair  Insight:  Fair  Psychomotor Activity:  Normal  Concentration:  Fair  Recall:  [employer] of Knowledge:Fair  Language: Fair  Akathisia:  No  Handed:  Right  AIMS (if indicated):  none  Assets:  Manufacturing systems engineer Physical Health Social Support Talents/Skills  ADL's:  Intact  Cognition: WNL  Sleep:  6-7    Is the patient at risk to self?  No. Has the patient been a risk to self in the past 6 months?  No. Has the patient been a risk to self within the distant past?  No. Is the patient a risk to others?  No. Has the patient been a risk to others in the past 6 months?  No. Has the patient been a risk to others within the distant past?  No.  Allergies:  No Known Allergies Current Medications: Current Outpatient Prescriptions  Medication Sig Dispense Refill  . Prenat-FeFum-FePo-FA-Omega 3 (TARON-C DHA) 53.5-38-1 MG CAPS Take 1 capsule by mouth daily.  1  . sertraline (ZOLOFT) 50 MG tablet Take 1 tablet (50 mg total) by mouth daily. 30 tablet 1   No current facility-administered medications for this visit.       Medical Decision Making:  Review of Psycho-Social Stressors (1)  Treatment Plan Summary: Medication management  Discussed with patient about the medications treatment risks benefits and alternatives Continue with sertraline 50 mg daily and was given 90 day supply of the medications.  She will follow up in 3 months     More than 50% of the time spent in psychoeducation, counseling and coordination of care.    This note was generated in part or whole with voice recognition software. Voice regonition is usually quite accurate but  there are transcription errors that can and very often do occur. I apologize for any typographical errors that were not detected and corrected.     Brandy Hale 11/1/20164:37 PM

## 2015-04-10 ENCOUNTER — Observation Stay
Admission: EM | Admit: 2015-04-10 | Discharge: 2015-04-11 | Disposition: A | Discharge status: Home / Self Care | Payer: 59 | Attending: Obstetrics and Gynecology | Admitting: Obstetrics and Gynecology

## 2015-04-10 ENCOUNTER — Encounter: Payer: Self-pay | Admitting: *Deleted

## 2015-04-10 DIAGNOSIS — O26893 Other specified pregnancy related conditions, third trimester: Secondary | ICD-10-CM | POA: Diagnosis not present

## 2015-04-10 DIAGNOSIS — Z3A39 39 weeks gestation of pregnancy: Secondary | ICD-10-CM | POA: Diagnosis not present

## 2015-04-10 DIAGNOSIS — R102 Pelvic and perineal pain: Secondary | ICD-10-CM | POA: Insufficient documentation

## 2015-04-10 NOTE — OB Triage Note (Signed)
Complaining of pelvic pressure starting at 2100 04/10/2015. No vaginal bleeding or leaking fluid. Positive fetal movement. No contractions reported.

## 2015-04-11 DIAGNOSIS — O26893 Other specified pregnancy related conditions, third trimester: Secondary | ICD-10-CM | POA: Diagnosis not present

## 2015-04-11 NOTE — Discharge Summary (Signed)
Reviewed discharge instructions with patient including signs of rupture, heavy vaginal bleeding, decreased fetal movement, and signs of pre-eclampsia. Patient disinterested in teaching, no eye contact or verbal interaction. Patient stated that she did not have any questions. Discharged in stable, ambulatory condition.

## 2015-04-15 ENCOUNTER — Emergency Department
Admission: EM | Admit: 2015-04-15 | Discharge: 2015-04-15 | Disposition: A | Discharge status: Home / Self Care | Payer: 59 | Source: Home / Self Care | Attending: Emergency Medicine | Admitting: Emergency Medicine

## 2015-04-15 ENCOUNTER — Encounter: Payer: Self-pay | Admitting: *Deleted

## 2015-04-15 DIAGNOSIS — O9902 Anemia complicating childbirth: Secondary | ICD-10-CM | POA: Diagnosis not present

## 2015-04-15 DIAGNOSIS — O2343 Unspecified infection of urinary tract in pregnancy, third trimester: Secondary | ICD-10-CM

## 2015-04-15 DIAGNOSIS — B349 Viral infection, unspecified: Secondary | ICD-10-CM

## 2015-04-15 LAB — URINALYSIS COMPLETE WITH MICROSCOPIC (ARMC ONLY)
BACTERIA UA: NONE SEEN
Bilirubin Urine: NEGATIVE
GLUCOSE, UA: NEGATIVE mg/dL
Hgb urine dipstick: NEGATIVE
Ketones, ur: NEGATIVE mg/dL
Nitrite: NEGATIVE
Protein, ur: NEGATIVE mg/dL
Specific Gravity, Urine: 1.009 (ref 1.005–1.030)
pH: 6 (ref 5.0–8.0)

## 2015-04-15 MED ORDER — ACETAMINOPHEN 325 MG PO TABS
ORAL_TABLET | ORAL | Status: AC
  Administered 2015-04-15: 650 mg via ORAL
  Filled 2015-04-15: qty 2

## 2015-04-15 MED ORDER — NITROFURANTOIN MONOHYD MACRO 100 MG PO CAPS: 100.0000 mg | ORAL_CAPSULE | Freq: Two times a day (BID) | ORAL | Status: DC

## 2015-04-15 MED ORDER — ACETAMINOPHEN 325 MG PO TABS
650.0000 mg | ORAL_TABLET | Freq: Once | ORAL | Status: AC
  Administered 2015-04-15: 650 mg via ORAL

## 2015-04-15 NOTE — Discharge Instructions (Signed)
You have been seen in the Emergency Department (ED) today for a likely viral illness.  Please drink plenty of clear fluids (water, Gatorade, chicken broth, etc).  You may use Tylenol according to label instructions, but avoid Motrin (ibuprofen), Aleve (naproxen), aspirin, and all other NSAIDs.  Please follow up with your doctor as listed above.  Call your doctor or return to the Emergency Department (ED) if you are unable to tolerate fluids due to vomiting, have worsening trouble breathing, become extremely tired or difficult to awaken, or if you develop any other symptoms that concern you.   Viral Infections A virus is a type of germ. Viruses can cause:  Minor sore throats.  Aches and pains.  Headaches.  Runny nose.  Rashes.  Watery eyes.  Tiredness.  Coughs.  Loss of appetite.  Feeling sick to your stomach (nausea).  Throwing up (vomiting).  Watery poop (diarrhea). HOME CARE   Only take medicines as told by your doctor.  Drink enough water and fluids to keep your pee (urine) clear or pale yellow. Sports drinks are a good choice.  Get plenty of rest and eat healthy. Soups and broths with crackers or rice are fine. GET HELP RIGHT AWAY IF:   You have a very bad headache.  You have shortness of breath.  You have chest pain or neck pain.  You have an unusual rash.  You cannot stop throwing up.  You have watery poop that does not stop.  You cannot keep fluids down.  You or your child has a temperature by mouth above 102 F (38.9 C), not controlled by medicine.  Your baby is older than 3 months with a rectal temperature of 102 F (38.9 C) or higher.  Your baby is 13 months old or younger with a rectal temperature of 100.4 F (38 C) or higher. MAKE SURE YOU:   Understand these instructions.  Will watch this condition.  Will get help right away if you are not doing well or get worse.   This information is not intended to replace advice given to you by  your health care provider. Make sure you discuss any questions you have with your health care provider.   Document Released: 05/04/2008 Document Revised: 08/14/2011 Document Reviewed: 10/28/2014 Elsevier Interactive Patient Education [employer]2016 Elsevier Inc.

## 2015-04-15 NOTE — ED Provider Notes (Addendum)
[facility] Emergency Department Provider Note  ____________________________________________  Time seen: Approximately 5:33 PM  I have reviewed the triage vital signs and the nursing notes.   HISTORY  Chief Complaint Fever    HPI Brenda Cohen is a 29 y.o. female G3 P2 at 7539 weeks gestation who goes to [facility] for her prenatal care.  She presents today with complaint of congestion, generalized body aches, and a fever to 100.  The symptoms started today.  She states that she was around a child that was ill with an unspecified viral illness recently.She states that she was afraid she is getting the flu so she called her OB and the nurse on the phone told her to come to the emergency department.  She was rather irate at the 1 hour wait to be seen and felt she should have gone immediately to the OB floor, but her nurse explained to her that she is being seen for a non-OB complaint which is why she stayed in the emergency department.  During my evaluation she was calm and reasonable and we did not have any issues during our discussion.  The patient denies shortness of breath, cough, chest pain, abdominal pain, vaginal bleeding/discharge.  She states that she has been having cramps intermittently for several weeks and nothing is different today.  She denies dysuria.  She does have some lower back pain which has been fairly constant recently.   Past Medical History  Diagnosis Date  . Anxiety   . Depression     Patient Active Problem List   Diagnosis Date Noted  . Labor and delivery, indication for care 04/10/2015  . Irregular contractions 03/29/2015  . Indication for care in labor or delivery 03/10/2015  . MDD (major depressive disorder), recurrent episode, mild (HCC) 01/08/2015  . Anxiety state 01/08/2015  . Major depressive disorder, recurrent episode, mild (HCC) 12/17/2014    Past Surgical History  Procedure Laterality Date  . Wisdom tooth  extraction      Current Outpatient Rx  Name  Route  Sig  Dispense  Refill  . nitrofurantoin, macrocrystal-monohydrate, (MACROBID) 100 MG capsule   Oral   Take 1 capsule (100 mg total) by mouth 2 (two) times daily.   14 capsule   0   . sertraline (ZOLOFT) 50 MG tablet   Oral   Take 1 tablet (50 mg total) by mouth daily.   90 tablet   1     Allergies Review of patient's allergies indicates no known allergies.  Family History  Problem Relation Age of Onset  . Drug abuse Mother   . Hyperlipidemia Father   . Hypertension Father   . Depression Father   . Migraines Sister   . Bipolar disorder Paternal Uncle   . Schizophrenia Paternal Uncle   . OCD Paternal Uncle     Social History Social History  Substance Use Topics  . Smoking status: Never Smoker   . Smokeless tobacco: Never Used  . Alcohol Use: 0.6 oz/week    1 Glasses of wine per week    Review of Systems Constitutional: "Fever" to 100.  Some generalized body aches Eyes: No visual changes. ENT: No sore throat.  Nasal congestion/runny nose. Cardiovascular: Denies chest pain. Respiratory: Denies shortness of breath. Gastrointestinal: No abdominal pain.  No nausea, no vomiting.  No diarrhea.  No constipation. Genitourinary: Negative for dysuria.  No vaginal bleeding or vaginal discharge Musculoskeletal: Negative for back pain. Skin: Negative for rash. Neurological: Negative for  headaches, focal weakness or numbness.  10-point ROS otherwise negative.  ____________________________________________   PHYSICAL EXAM:  VITAL SIGNS: ED Triage Vitals  Enc Vitals Group     BP 04/15/15 1644 125/74 mmHg     Pulse Rate 04/15/15 1644 100     Resp 04/15/15 1644 20     Temp 04/15/15 1644 98.7 F (37.1 C)     Temp Source 04/15/15 1644 Oral     SpO2 04/15/15 1644 96 %     Weight 04/15/15 1644 158 lb (71.668 kg)     Height 04/15/15 1644  (1.549 m)     Head Cir --      Peak Flow --      Pain Score --      Pain  Loc --      Pain Edu? --      Excl. in GC? --     Constitutional: Alert and oriented. Well appearing and in no acute distress. Eyes: Conjunctivae are normal. PERRL. EOMI. Head: Atraumatic. Nose: Mild nasal congestion Mouth/Throat: Mucous membranes are moist.  Oropharynx non-erythematous. Neck: No stridor.   Cardiovascular: Borderline tachycardia, regular rhythm. Grossly normal heart sounds.  Good peripheral circulation. Respiratory: Normal respiratory effort.  No retractions. Lungs CTAB. Gastrointestinal: Gravid uterus at term.  Nontender. No distention. No abdominal bruits. No CVA tenderness. Musculoskeletal: No lower extremity tenderness nor edema.  No joint effusions. Neurologic:  Normal speech and language. No gross focal neurologic deficits are appreciated.  Skin:  Skin is warm, dry and intact. No rash noted. Psychiatric: Mood and affect are normal. Speech and behavior are normal.  ____________________________________________   LABS (all labs ordered are listed, but only abnormal results are displayed)  Labs Reviewed  URINALYSIS COMPLETEWITH MICROSCOPIC ([facility] ONLY) - Abnormal; Notable for the following:    Color, Urine YELLOW (*)    APPearance CLEAR (*)    Leukocytes, UA 1+ (*)    Squamous Epithelial / LPF 6-30 (*)    All other components within normal limits  URINE CULTURE   ____________________________________________  EKG  Not indicated ____________________________________________  RADIOLOGY   No results found.  ____________________________________________   PROCEDURES  Procedure(s) performed: None  Critical Care performed: No ____________________________________________   INITIAL IMPRESSION / ASSESSMENT AND PLAN / ED COURSE  Pertinent labs & imaging results that were available during my care of the patient were reviewed by me and considered in my medical decision making (see chart for details).  Well-appearing, no acute distress, afebrile,  reassuring vitals.  She has no signs or symptoms of early labor.  She has no signs or symptoms of serious infection.  I had a discussion with her about viral syndrome, taking Tylenol but avoiding NSAIDs, drinking plenty of clear fluids, and following up with her OB.  She understands and agrees with the plan.  Her urinalysis indicated a few whites/ovocytes and given that she is pregnant I decided to treat empirically as well as sent a urine culture.  ____________________________________________  FINAL CLINICAL IMPRESSION(S) / ED DIAGNOSES  Final diagnoses:  Viral syndrome  UTI (urinary tract infection) during pregnancy, third trimester      NEW MEDICATIONS STARTED DURING THIS VISIT:  New Prescriptions   NITROFURANTOIN, MACROCRYSTAL-MONOHYDRATE, (MACROBID) 100 MG CAPSULE    Take 1 capsule (100 mg total) by mouth 2 (two) times daily.     Loleta Rose, MD 04/15/15 1753  Loleta Rose, MD 04/15/15 [phone]

## 2015-04-15 NOTE — ED Notes (Signed)
When pt brought back from lobby pt verbally upset using foul language about wait time and process used at [facility] "I don't know how y'all do sh*t around here".  This nurse talked with patient and asked if she wanted to speak with charge nurse.  Pt refused that option "it won't do nothing" but pt agreed to speak with Dr. York CeriseForbach.  Pt calming down and agreeing to treatment.

## 2015-04-15 NOTE — ED Notes (Signed)
Pt is [redacted] weeks pregnant developed a fever of 100 today with body aches

## 2015-04-15 NOTE — ED Notes (Signed)
Pt alert and oriented X4, active, cooperative, pt in NAD. RR even and unlabored, color WNL.  Pt informed to return if any life threatening symptoms occur.   

## 2015-04-17 LAB — URINE CULTURE: Special Requests: NORMAL

## 2015-04-18 ENCOUNTER — Inpatient Hospital Stay
Admission: EM | Admit: 2015-04-18 | Discharge: 2015-04-20 | DRG: 767 | Disposition: A | Discharge status: Home / Self Care | Payer: 59 | Attending: Obstetrics & Gynecology | Admitting: Obstetrics & Gynecology

## 2015-04-18 ENCOUNTER — Inpatient Hospital Stay: Payer: 59 | Admitting: Anesthesiology

## 2015-04-18 DIAGNOSIS — D649 Anemia, unspecified: Secondary | ICD-10-CM | POA: Diagnosis present

## 2015-04-18 DIAGNOSIS — O99324 Drug use complicating childbirth: Secondary | ICD-10-CM | POA: Diagnosis present

## 2015-04-18 DIAGNOSIS — Z3A39 39 weeks gestation of pregnancy: Secondary | ICD-10-CM | POA: Diagnosis not present

## 2015-04-18 DIAGNOSIS — F419 Anxiety disorder, unspecified: Secondary | ICD-10-CM | POA: Diagnosis present

## 2015-04-18 DIAGNOSIS — Z302 Encounter for sterilization: Secondary | ICD-10-CM | POA: Diagnosis not present

## 2015-04-18 DIAGNOSIS — F329 Major depressive disorder, single episode, unspecified: Secondary | ICD-10-CM | POA: Diagnosis present

## 2015-04-18 DIAGNOSIS — O9902 Anemia complicating childbirth: Principal | ICD-10-CM | POA: Diagnosis present

## 2015-04-18 DIAGNOSIS — F1290 Cannabis use, unspecified, uncomplicated: Secondary | ICD-10-CM | POA: Diagnosis present

## 2015-04-18 DIAGNOSIS — O99344 Other mental disorders complicating childbirth: Secondary | ICD-10-CM | POA: Diagnosis present

## 2015-04-18 LAB — CBC
HEMATOCRIT: 31.2 % — AB (ref 35.0–47.0)
HEMOGLOBIN: 9.8 g/dL — AB (ref 12.0–16.0)
MCH: 24.2 pg — ABNORMAL LOW (ref 26.0–34.0)
MCHC: 31.5 g/dL — AB (ref 32.0–36.0)
MCV: 76.9 fL — ABNORMAL LOW (ref 80.0–100.0)
Platelets: 211 10*3/uL (ref 150–440)
RBC: 4.05 MIL/uL (ref 3.80–5.20)
RDW: 17.3 % — ABNORMAL HIGH (ref 11.5–14.5)
WBC: 11.8 10*3/uL — ABNORMAL HIGH (ref 3.6–11.0)

## 2015-04-18 LAB — TYPE AND SCREEN
ABO/RH(D): A POS
ANTIBODY SCREEN: NEGATIVE

## 2015-04-18 MED ORDER — OXYTOCIN 40 UNITS IN LACTATED RINGERS INFUSION - SIMPLE MED
62.5000 mL/h | INTRAVENOUS | Status: DC
  Filled 2015-04-18: qty 1000

## 2015-04-18 MED ORDER — EPHEDRINE 5 MG/ML INJ
10.0000 mg | INTRAVENOUS | Status: DC | PRN
  Filled 2015-04-18: qty 2

## 2015-04-18 MED ORDER — LACTATED RINGERS IV SOLN
INTRAVENOUS | Status: DC
  Administered 2015-04-18 (×2): via INTRAVENOUS

## 2015-04-18 MED ORDER — OXYTOCIN BOLUS FROM INFUSION
500.0000 mL | INTRAVENOUS | Status: DC
  Administered 2015-04-19: 500 mL via INTRAVENOUS

## 2015-04-18 MED ORDER — LACTATED RINGERS IV SOLN
500.0000 mL | INTRAVENOUS | Status: DC | PRN
  Administered 2015-04-18: 500 mL via INTRAVENOUS

## 2015-04-18 MED ORDER — BUTORPHANOL TARTRATE 1 MG/ML IJ SOLN
INTRAMUSCULAR | Status: AC
  Administered 2015-04-18: 1 mg via INTRAVENOUS
  Filled 2015-04-18: qty 1

## 2015-04-18 MED ORDER — DIPHENHYDRAMINE HCL 50 MG/ML IJ SOLN: 12.5000 mg | INTRAMUSCULAR | Status: DC | PRN

## 2015-04-18 MED ORDER — TERBUTALINE SULFATE 1 MG/ML IJ SOLN: 0.2500 mg | Freq: Once | INTRAMUSCULAR | Status: DC | PRN

## 2015-04-18 MED ORDER — FENTANYL 2.5 MCG/ML W/ROPIVACAINE 0.2% IN NS 100 ML EPIDURAL INFUSION (ARMC-ANES)
EPIDURAL | Status: AC
  Filled 2015-04-18: qty 100

## 2015-04-18 MED ORDER — OXYTOCIN 40 UNITS IN LACTATED RINGERS INFUSION - SIMPLE MED
1.0000 m[IU]/min | INTRAVENOUS | Status: DC
  Administered 2015-04-18: 1 m[IU]/min via INTRAVENOUS

## 2015-04-18 MED ORDER — FENTANYL 2.5 MCG/ML W/ROPIVACAINE 0.2% IN NS 100 ML EPIDURAL INFUSION (ARMC-ANES)
EPIDURAL | Status: DC | PRN
  Administered 2015-04-18: 10 mL/h via EPIDURAL

## 2015-04-18 MED ORDER — FENTANYL 2.5 MCG/ML W/ROPIVACAINE 0.2% IN NS 100 ML EPIDURAL INFUSION (ARMC-ANES): 10.0000 mL/h | EPIDURAL | Status: DC

## 2015-04-18 MED ORDER — PHENYLEPHRINE 40 MCG/ML (10ML) SYRINGE FOR IV PUSH (FOR BLOOD PRESSURE SUPPORT)
80.0000 ug | PREFILLED_SYRINGE | INTRAVENOUS | Status: DC | PRN
  Filled 2015-04-18: qty 2

## 2015-04-18 MED ORDER — BUPIVACAINE HCL (PF) 0.25 % IJ SOLN
INTRAMUSCULAR | Status: DC | PRN
  Administered 2015-04-18: 5 mL via EPIDURAL

## 2015-04-18 MED ORDER — LIDOCAINE-EPINEPHRINE (PF) 1.5 %-1:200000 IJ SOLN
INTRAMUSCULAR | Status: DC | PRN
  Administered 2015-04-18: 2 mL via EPIDURAL
  Administered 2015-04-18: 3 mL via EPIDURAL

## 2015-04-18 MED ORDER — BUTORPHANOL TARTRATE 1 MG/ML IJ SOLN
1.0000 mg | Freq: Once | INTRAMUSCULAR | Status: AC
  Administered 2015-04-18: 1 mg via INTRAVENOUS

## 2015-04-18 NOTE — Anesthesia Procedure Notes (Addendum)
Epidural Patient location during procedure: OB Start time: 04/18/2015 9:50 PM End time: 04/18/2015 10:18 PM  Staffing Resident/CRNA: Clovis FredricksonRISSON, Eladia Frame Performed by: resident/CRNA   Preanesthetic Checklist Completed: patient identified, site marked, surgical consent, pre-op evaluation, timeout performed, IV checked, risks and benefits discussed and monitors and equipment checked  Epidural Patient position: sitting Prep: Betadine Patient monitoring: continuous pulse ox and blood pressure Approach: midline Location: L3-L4 Injection technique: LOR air  Needle:  Needle type: Tuohy  Needle gauge: 18 G Needle length: 9 cm Needle insertion depth: 6.5 cm Catheter type: closed end Catheter size: 20 Guage Catheter at skin depth: 12 cm Test dose: negative and 1.5% lidocaine with Epi 1:200 K  Assessment Sensory level: T10 Events: blood not aspirated, injection not painful, no injection resistance and no paresthesia  Additional Notes Reason for block:procedure for pain

## 2015-04-18 NOTE — Anesthesia Preprocedure Evaluation (Addendum)
Anesthesia Evaluation  Patient identified by MRN, date of birth, ID band Patient awake    Reviewed: Allergy & Precautions, NPO status , Patient's Chart, lab work & pertinent test results  Airway Mallampati: II  TM Distance: >3 FB Neck ROM: Full    Dental no notable dental hx.    Pulmonary neg pulmonary ROS,    Pulmonary exam normal breath sounds clear to auscultation       Cardiovascular negative cardio ROS Normal cardiovascular exam     Neuro/Psych Anxiety Depression negative neurological ROS     GI/Hepatic negative GI ROS, Neg liver ROS,   Endo/Other  negative endocrine ROS  Renal/GU negative Renal ROS  negative genitourinary   Musculoskeletal negative musculoskeletal ROS (+)   Abdominal Normal abdominal exam  (+)   Peds negative pediatric ROS (+)  Hematology negative hematology ROS (+)   Anesthesia Other Findings   Reproductive/Obstetrics (+) Pregnancy                             Anesthesia Physical Anesthesia Plan  ASA: II  Anesthesia Plan: Epidural   Post-op Pain Management:    Induction:   Airway Management Planned: Natural Airway  Additional Equipment:   Intra-op Plan:   Post-operative Plan:   Informed Consent: I have reviewed the patients History and Physical, chart, labs and discussed the procedure including the risks, benefits and alternatives for the proposed anesthesia with the patient or authorized representative who has indicated his/her understanding and acceptance.     Plan Discussed with: CRNA and Surgeon  Anesthesia Plan Comments:         Anesthesia Quick Evaluation

## 2015-04-18 NOTE — H&P (Signed)
OB History & Physical   History of Present Illness:  Chief Complaint: "my water broke" HPI:  Brenda Cohen is a 29 y.o. [id number] female at [redacted]w[redacted]d dated by LMP c/w 1st trimester [place] with EDC of 04/22/15.  She presents to L&D for complaints of her water breaking around 5pm.  Desires BTL post partum.  +FM, mild CTX, + LOF, no VB  Pregnancy Issues: 1. Social discord with FOB, seemingly resolved 2. Anemia 3. ppBTL desired   Maternal Medical History:   Past Medical History  Diagnosis Date  . Anxiety   . Depression     Past Surgical History  Procedure Laterality Date  . Wisdom tooth extraction      No Known Allergies  Prior to Admission medications   Medication Sig Start Date End Date Taking? Authorizing Provider  nitrofurantoin, macrocrystal-monohydrate, (MACROBID) 100 MG capsule Take 1 capsule (100 mg total) by mouth 2 (two) times daily. 04/15/15 04/22/15  Loleta Rose, MD  sertraline (ZOLOFT) 50 MG tablet Take 1 tablet (50 mg total) by mouth daily. 04/06/15   Brandy Hale, MD     Prenatal care site: [facility]   Social History: She  reports that she has never smoked. She has never used smokeless tobacco. She reports that she drinks about 0.6 oz of alcohol per week. She reports that she uses illicit drugs (Marijuana).  Family History: family history includes Bipolar disorder in her paternal uncle; Depression in her father; Drug abuse in her mother; Hyperlipidemia in her father; Hypertension in her father; Migraines in her sister; OCD in her paternal uncle; Schizophrenia in her paternal uncle.   Review of Systems: Negative x 10 systems reviewed except as noted in the HPI.     Physical Exam:  Vital Signs: BP 100/59 mmHg  Pulse 79  Temp(Src) 98.6 F (37 C) (Oral)  Resp 18  Ht  (1.549 m)  Wt 71.668 kg (158 lb)  BMI 29.87 kg/m2  SpO2 100%  LMP 07/16/2014 General: no acute distress.  HEENT: normocephalic, atraumatic Heart: regular rate & rhythm.  No  murmurs/rubs/gallops Lungs: clear to auscultation bilaterally, normal respiratory effort Abdomen: soft, gravid, non-tender;  EFW: 7.0 Pelvic:   External: Normal external female genitalia  Cervix: Dilation: 5 / Effacement (%): 60 / Station: -2    Extremities: non-tender, symmetric, no edema bilaterally.  DTRs2+  Neurologic: Alert & oriented x 3.    Results for orders placed or performed during the hospital encounter of 04/18/15 (from the past 24 hour(s))  CBC     Status: Abnormal   Collection Time: 04/18/15  7:55 PM  Result Value Ref Range   WBC 11.8 (H) 3.6 - 11.0 K/uL   RBC 4.05 3.80 - 5.20 MIL/uL   Hemoglobin 9.8 (L) 12.0 - 16.0 g/dL   HCT 14.7 (L) 82.9 - 56.2 %   MCV 76.9 (L) 80.0 - 100.0 fL   MCH 24.2 (L) 26.0 - 34.0 pg   MCHC 31.5 (L) 32.0 - 36.0 g/dL   RDW 13.0 (H) 86.5 - 78.4 %   Platelets 211 150 - 440 K/uL  Type and screen     Status: None   Collection Time: 04/18/15  7:56 PM  Result Value Ref Range   ABO/RH(D) A POS    Antibody Screen NEG    Sample Expiration 04/21/2015     Pertinent Results:  Prenatal Labs: Blood type/Rh A+  Antibody screen neg  Rubella Immune  Varicella Immune  RPR NR  HBsAg Neg  HIV NR  GC neg  Chlamydia neg  Genetic screening negative  1 hour GTT 138  3 hour GTT --  GBS Neg 03/26/15   FHT: 150 mod + accels no decels TOCO: q593min SVE: 4/50/-2 Grossly ruptured   Cephalic by leopolds  Assessment:  Brenda Cohen is a 29 y.o. 783P2002 female at 6243w3d with SROM.   Plan:  1. Admit to Labor & Delivery 2. CBC, T&S, Clrs, IVF 3. GBS neg  4. Consents obtained. 5. Continuous efm/toco 6. Expectant management for now, if no significant change in 2 hours will start pitocin.  7. FOB in delivery room per patient.  8.   Depending on time of delivery, make NPO for PPTL in AM of PPD 1  ----- Ranae Plumberhelsea Chauntelle Azpeitia, MD Attending Obstetrician and Gynecologist [facility]

## 2015-04-19 ENCOUNTER — Encounter
Admission: EM | Disposition: A | Discharge status: Home / Self Care | Payer: Self-pay | Source: Home / Self Care | Attending: Obstetrics & Gynecology

## 2015-04-19 ENCOUNTER — Inpatient Hospital Stay: Payer: 59 | Admitting: Anesthesiology

## 2015-04-19 HISTORY — PX: TUBAL LIGATION: SHX77

## 2015-04-19 LAB — CBC
HCT: 30.4 % — ABNORMAL LOW (ref 35.0–47.0)
Hemoglobin: 9.5 g/dL — ABNORMAL LOW (ref 12.0–16.0)
MCH: 23.9 pg — ABNORMAL LOW (ref 26.0–34.0)
MCHC: 31.2 g/dL — AB (ref 32.0–36.0)
MCV: 76.5 fL — AB (ref 80.0–100.0)
PLATELETS: 194 10*3/uL (ref 150–440)
RBC: 3.97 MIL/uL (ref 3.80–5.20)
RDW: 17 % — AB (ref 11.5–14.5)
WBC: 17.1 10*3/uL — AB (ref 3.6–11.0)

## 2015-04-19 SURGERY — LIGATION, FALLOPIAN TUBE, POSTPARTUM
Anesthesia: Epidural | Site: Abdomen | Laterality: Bilateral

## 2015-04-19 MED ORDER — DIBUCAINE 1 % RE OINT: 1.0000 "application " | TOPICAL_OINTMENT | RECTAL | Status: DC | PRN

## 2015-04-19 MED ORDER — FENTANYL CITRATE (PF) 100 MCG/2ML IJ SOLN
INTRAMUSCULAR | Status: DC | PRN
  Administered 2015-04-19: 100 ug via INTRAVENOUS

## 2015-04-19 MED ORDER — KETOROLAC TROMETHAMINE 30 MG/ML IJ SOLN
INTRAMUSCULAR | Status: AC
  Filled 2015-04-19: qty 1

## 2015-04-19 MED ORDER — SUCCINYLCHOLINE CHLORIDE 20 MG/ML IJ SOLN
INTRAMUSCULAR | Status: DC | PRN
  Administered 2015-04-19: 80 mg via INTRAVENOUS

## 2015-04-19 MED ORDER — ONDANSETRON HCL 4 MG PO TABS: 4.0000 mg | ORAL_TABLET | ORAL | Status: DC | PRN

## 2015-04-19 MED ORDER — FENTANYL CITRATE (PF) 100 MCG/2ML IJ SOLN
INTRAMUSCULAR | Status: AC
  Filled 2015-04-19: qty 2

## 2015-04-19 MED ORDER — LIDOCAINE HCL (CARDIAC) 20 MG/ML IV SOLN
INTRAVENOUS | Status: DC | PRN
  Administered 2015-04-19: 40 mg via INTRAVENOUS

## 2015-04-19 MED ORDER — PROPOFOL 10 MG/ML IV BOLUS
INTRAVENOUS | Status: DC | PRN
  Administered 2015-04-19: 150 mg via INTRAVENOUS

## 2015-04-19 MED ORDER — SERTRALINE HCL 25 MG PO TABS
50.0000 mg | ORAL_TABLET | Freq: Every day | ORAL | Status: DC
  Administered 2015-04-20: 50 mg via ORAL
  Filled 2015-04-19: qty 2
  Filled 2015-04-19: qty 1

## 2015-04-19 MED ORDER — SIMETHICONE 80 MG PO CHEW
80.0000 mg | CHEWABLE_TABLET | Freq: Two times a day (BID) | ORAL | Status: DC
  Administered 2015-04-19 – 2015-04-20 (×2): 80 mg via ORAL
  Filled 2015-04-19 (×3): qty 1

## 2015-04-19 MED ORDER — WITCH HAZEL-GLYCERIN EX PADS: 1.0000 "application " | MEDICATED_PAD | CUTANEOUS | Status: DC | PRN

## 2015-04-19 MED ORDER — ACETAMINOPHEN 325 MG PO TABS: 650.0000 mg | ORAL_TABLET | ORAL | Status: DC | PRN

## 2015-04-19 MED ORDER — OXYTOCIN 40 UNITS IN LACTATED RINGERS INFUSION - SIMPLE MED: 62.5000 mL/h | INTRAVENOUS | Status: DC | PRN

## 2015-04-19 MED ORDER — BUPIVACAINE HCL (PF) 0.5 % IJ SOLN
INTRAMUSCULAR | Status: AC
  Filled 2015-04-19: qty 30

## 2015-04-19 MED ORDER — KETOROLAC TROMETHAMINE 30 MG/ML IJ SOLN
30.0000 mg | Freq: Once | INTRAMUSCULAR | Status: AC
  Administered 2015-04-19: 30 mg via INTRAVENOUS

## 2015-04-19 MED ORDER — LANOLIN HYDROUS EX OINT: TOPICAL_OINTMENT | CUTANEOUS | Status: DC | PRN

## 2015-04-19 MED ORDER — ONDANSETRON HCL 4 MG/2ML IJ SOLN: 4.0000 mg | Freq: Once | INTRAMUSCULAR | Status: DC | PRN

## 2015-04-19 MED ORDER — SIMETHICONE 80 MG PO CHEW
80.0000 mg | CHEWABLE_TABLET | ORAL | Status: DC | PRN
  Filled 2015-04-19: qty 1

## 2015-04-19 MED ORDER — OXYCODONE-ACETAMINOPHEN 5-325 MG PO TABS
1.0000 | ORAL_TABLET | ORAL | Status: DC | PRN
  Administered 2015-04-19 – 2015-04-20 (×5): 1 via ORAL
  Filled 2015-04-19 (×5): qty 1

## 2015-04-19 MED ORDER — BUPIVACAINE HCL 0.5 % IJ SOLN
INTRAMUSCULAR | Status: DC | PRN
  Administered 2015-04-19 (×2): 5 mL

## 2015-04-19 MED ORDER — DIPHENHYDRAMINE HCL 25 MG PO CAPS: 25.0000 mg | ORAL_CAPSULE | Freq: Four times a day (QID) | ORAL | Status: DC | PRN

## 2015-04-19 MED ORDER — LACTATED RINGERS IV SOLN
INTRAVENOUS | Status: DC | PRN
  Administered 2015-04-19 (×2): via INTRAVENOUS

## 2015-04-19 MED ORDER — DOCUSATE SODIUM 100 MG PO CAPS
100.0000 mg | ORAL_CAPSULE | Freq: Two times a day (BID) | ORAL | Status: DC
  Administered 2015-04-19 – 2015-04-20 (×3): 100 mg via ORAL
  Filled 2015-04-19 (×3): qty 1

## 2015-04-19 MED ORDER — BENZOCAINE-MENTHOL 20-0.5 % EX AERO
1.0000 "application " | INHALATION_SPRAY | CUTANEOUS | Status: DC | PRN
  Administered 2015-04-20: 1 via TOPICAL
  Filled 2015-04-19 (×2): qty 56

## 2015-04-19 MED ORDER — FENTANYL CITRATE (PF) 100 MCG/2ML IJ SOLN
25.0000 ug | INTRAMUSCULAR | Status: AC | PRN
  Administered 2015-04-19 (×6): 25 ug via INTRAVENOUS

## 2015-04-19 MED ORDER — ROCURONIUM BROMIDE 100 MG/10ML IV SOLN
INTRAVENOUS | Status: DC | PRN
  Administered 2015-04-19: 5 mg via INTRAVENOUS

## 2015-04-19 MED ORDER — PRENATAL MULTIVITAMIN CH
1.0000 | ORAL_TABLET | Freq: Every day | ORAL | Status: DC
  Administered 2015-04-19: 1 via ORAL
  Filled 2015-04-19: qty 1

## 2015-04-19 MED ORDER — INFLUENZA VAC SPLIT QUAD 0.5 ML IM SUSY: 0.5000 mL | PREFILLED_SYRINGE | INTRAMUSCULAR | Status: DC | PRN

## 2015-04-19 MED ORDER — ONDANSETRON HCL 4 MG/2ML IJ SOLN: 4.0000 mg | INTRAMUSCULAR | Status: DC | PRN

## 2015-04-19 MED ORDER — LACTATED RINGERS IV SOLN: INTRAVENOUS | Status: DC

## 2015-04-19 SURGICAL SUPPLY — 27 items
CHLORAPREP W/TINT 26ML (MISCELLANEOUS) ×3 IMPLANT
DECANTER SPIKE VIAL GLASS SM (MISCELLANEOUS) ×3 IMPLANT
DRAPE LAPAROTOMY 100X77 ABD (DRAPES) ×3 IMPLANT
DRESSING TELFA 4X3 1S ST N-ADH (GAUZE/BANDAGES/DRESSINGS) ×3 IMPLANT
DRSG TEGADERM 2-3/8X2-3/4 SM (GAUZE/BANDAGES/DRESSINGS) ×3 IMPLANT
ELECT CAUTERY BLADE 6.4 (BLADE) ×3 IMPLANT
GLOVE BIO SURGEON STRL SZ7 (GLOVE) ×3 IMPLANT
GLOVE INDICATOR 7.5 STRL GRN (GLOVE) ×3 IMPLANT
GOWN STRL REUS W/ TWL LRG LVL3 (GOWN DISPOSABLE) ×1 IMPLANT
GOWN STRL REUS W/ TWL XL LVL3 (GOWN DISPOSABLE) ×1 IMPLANT
GOWN STRL REUS W/TWL LRG LVL3 (GOWN DISPOSABLE) ×2
GOWN STRL REUS W/TWL XL LVL3 (GOWN DISPOSABLE) ×2
KIT RM TURNOVER CYSTO AR (KITS) ×3 IMPLANT
LABEL OR SOLS (LABEL) ×3 IMPLANT
LIQUID BAND (GAUZE/BANDAGES/DRESSINGS) ×3 IMPLANT
NDL SAFETY 22GX1.5 (NEEDLE) ×3 IMPLANT
NS IRRIG 500ML POUR BTL (IV SOLUTION) ×3 IMPLANT
PACK BASIN MINOR ARMC (MISCELLANEOUS) ×3 IMPLANT
PAD GROUND ADULT SPLIT (MISCELLANEOUS) ×3 IMPLANT
SPONGE LAP 18X18 5 PK (GAUZE/BANDAGES/DRESSINGS) ×3 IMPLANT
SUT PLAIN 2 0 (SUTURE) ×2
SUT PLAIN ABS 2-0 CT1 27XMFL (SUTURE) ×1 IMPLANT
SUT VIC AB 0 SH 27 (SUTURE) ×6 IMPLANT
SUT VIC AB 2-0 UR6 27 (SUTURE) ×3 IMPLANT
SUT VIC AB 4-0 PS2 18 (SUTURE) ×3 IMPLANT
SUT VICRYL 0 TIES 12 18 (SUTURE) ×3 IMPLANT
SYRINGE 10CC LL (SYRINGE) ×3 IMPLANT

## 2015-04-19 NOTE — Anesthesia Preprocedure Evaluation (Signed)
Anesthesia Evaluation  Patient identified by MRN, date of birth, ID band Patient awake    Reviewed: Allergy & Precautions, NPO status , Patient's Chart, lab work & pertinent test results  Airway Mallampati: II  TM Distance: >3 FB Neck ROM: Full    Dental no notable dental hx.    Pulmonary neg pulmonary ROS,    Pulmonary exam normal breath sounds clear to auscultation       Cardiovascular negative cardio ROS Normal cardiovascular exam     Neuro/Psych Anxiety Depression negative neurological ROS     GI/Hepatic negative GI ROS, Neg liver ROS,   Endo/Other  negative endocrine ROS  Renal/GU negative Renal ROS  negative genitourinary   Musculoskeletal negative musculoskeletal ROS (+)   Abdominal Normal abdominal exam  (+)   Peds negative pediatric ROS (+)  Hematology negative hematology ROS (+)   Anesthesia Other Findings   Reproductive/Obstetrics (+) Pregnancy                             Anesthesia Physical  Anesthesia Plan  ASA: II  Anesthesia Plan: Epidural and General   Post-op Pain Management:    Induction: Rapid sequence and Intravenous  Airway Management Planned: Oral ETT  Additional Equipment:   Intra-op Plan:   Post-operative Plan:   Informed Consent: I have reviewed the patients History and Physical, chart, labs and discussed the procedure including the risks, benefits and alternatives for the proposed anesthesia with the patient or authorized representative who has indicated his/her understanding and acceptance.     Plan Discussed with: CRNA and Surgeon  Anesthesia Plan Comments:         Anesthesia Quick Evaluation

## 2015-04-19 NOTE — Plan of Care (Signed)
Problem: Nutritional: Goal: Dietary intake will improve Outcome: Not Progressing NPO for Surgery

## 2015-04-19 NOTE — Anesthesia Postprocedure Evaluation (Signed)
  Anesthesia Post-op Note  Patient: Brenda Cohen  Procedure(s) Performed: Procedure(s): POST PARTUM TUBAL LIGATION (Bilateral)  Anesthesia type:Epidural, General  Patient location: PACU  Post pain: Pain level controlled  Post assessment: Post-op Vital signs reviewed, Patient's Cardiovascular Status Stable, Respiratory Function Stable, Patent Airway and No signs of Nausea or vomiting  Post vital signs: Reviewed and stable  Last Vitals:  Filed Vitals:   04/19/15 0755  BP: 123/77  Pulse: 86  Temp: 36.8 C  Resp: 18    Level of consciousness: awake, alert  and patient cooperative  Complications: No apparent anesthesia complications

## 2015-04-19 NOTE — Transfer of Care (Signed)
Immediate Anesthesia Transfer of Care Note  Patient: Brenda HoofAmber M Batzel  Procedure(s) Performed: Procedure(s): POST PARTUM TUBAL LIGATION (Bilateral)  Patient Location: PACU  Anesthesia Type:General  Level of Consciousness: sedated  Airway & Oxygen Therapy: Patient Spontanous Breathing  Post-op Assessment: Report given to RN  Post vital signs: Reviewed  Last Vitals:  Filed Vitals:   04/19/15 0755  BP: 123/77  Pulse: 86  Temp: 36.8 C  Resp: 18    Complications: No apparent anesthesia complications

## 2015-04-19 NOTE — Discharge Instructions (Signed)
Discharge instructions:   Call office if you have any of the following: headache, visual changes, fever >101 F, chills, breast concerns, excessive vaginal bleeding, incision drainage or problems, leg pain or redness, depression or any other concerns.   No strenuous activity or heavy lifting for 6 weeks.  No intercourse, tampons, douching, or enemas for 6 weeks.  No tub baths-showers only.  No driving for 2 weeks or while taking pain medications.  Continue prenatal vitamin and iron.  Increase calories and fluids while breastfeeding.  For concerns about your baby, call your pediatrician For concerns about breastfeeding, contact the lactation consultant.

## 2015-04-19 NOTE — Clinical Social Work Maternal (Signed)
  CLINICAL SOCIAL WORK MATERNAL/CHILD NOTE  Patient Details  Name: Brenda Cohen MRN: [id number] Date of Birth: [birth year]  Date:  04/19/2015  Clinical Social Worker Initiating Note:  Shela Leff MSW,LCSW Date/ Time Initiated:  04/19/15/1127     Child's Name:  Cephus Shelling   Legal Guardian:  Father   Need for Interpreter:  None   Date of Referral:  04/19/15     Reason for Referral:   (Resources)   Referral Source:  Physician   Address:     Phone number:      Household Members:  Self   Natural Supports (not living in the home):  Spouse/significant other, Parent   Professional Supports: Other (Comment) (psychiatrist and therapist)   Employment: Animator (Now on short term disability)   Type of Work:     Education:      Pensions consultant:  Multimedia programmer   Other Resources:  Physicist, medical , [place]   Cultural/Religious Considerations Which May Impact Care:  none  Strengths:  Compliance with medical plan , Home prepared for child    Risk Factors/Current Problems:   Dispensing optician )   Cognitive State:  Able to Concentrate , Alert    Mood/Affect:  Comfortable , Calm    CSW Assessment: CSW consulted for possible questions regarding food and clothing. CSW spoke with patient's nurse this morning and patient is caring for her newborn appropriately. CSW met with patient and her mother and father of newborn this morning in patient's hospital room. Patient informed me that she is concerned about being able to pay her rent in the near future while she is on short term disability from her work. Patient stated that she plans to discuss this with her landlord to see if any arrangements can be made. Patient tells CSW that in the event of the worst case scenario and she has to move, she would be able to stay with family. Patient tells CSW that she is actually good with food and clothing and other necessities for her and her newborn.   CSW spoke with patient about information  which was reviewed in patient's medical record regarding concerns patient had about her mood swings back in May. Patient confirmed that she did follow up with a psychiatrist and a therapist and that the psychiatrist prescribed her zoloft. And that she sees the psychiatrist once a month but does not have to see her again for 3 months this time as the psychiatrist prescribed her a 3 month supply of zoloft due to her delivering. Patient states she does not see her therapist any longer but can return to her if needed. Patient states her mood swings are more stable and is overall feeling better about her situation.   CSW Plan/Description:  Information/Referral to [employer], [place] 04/19/2015, 11:34 AM

## 2015-04-19 NOTE — Progress Notes (Addendum)
Daily Post Partum Note  Brenda Cohen is a 29 y.o. [id number] PPD#0 s/p  SVD/intact perineum.  Pregnancy c/b difficult social situation  24hr/overnight events:  None  Subjective:  NPO since 0200 (clears and crackers), when she delivered and with decreasing lochia. Pt doing well  Objective:    Current Vital Signs 24h Vital Sign Ranges  T 98.7 F (37.1 C) Temp  Avg: 98.5 F (36.9 C)  Min: 98.1 F (36.7 C)  Max: 98.7 F (37.1 C)  BP (!) 120/55 mmHg BP  Min: 96/59  Max: 142/82  HR 92 Pulse  Avg: 99.6  Min: 77  Max: 121  RR 18 Resp  Avg: 18  Min: 17  Max: 19  SaO2 98 % RA SpO2  Avg: 99.2 %  Min: 97 %  Max: 100 %       24 Hour I/O Current Shift I/O  Time Ins Outs 11/13 0701 - 11/14 0700 In: 1826.4 [I.V.:1526.4] Out: 400 [Urine:400]      General: NAD Abdomen: FF below the umbilicus, nttp, nd Perineum: deferred Skin:  Warm and dry.  Cardiovascular: Regular rate and rhythm. Respiratory:  Clear to auscultation bilateral. Normal respiratory effort Extremities: no c/c/e  Medications Current Facility-Administered Medications  Medication Dose Route Frequency Provider Last Rate Last Dose  . acetaminophen (TYLENOL) tablet 650 mg  650 mg Oral Q4H PRN Chelsea C Ward, MD      . benzocaine-Menthol (DERMOPLAST) 20-0.5 % topical spray 1 application  1 application Topical PRN Chelsea C Ward, MD      . witch hazel-glycerin (TUCKS) pad 1 application  1 application Topical PRN Chelsea C Ward, MD       And  . dibucaine (NUPERCAINAL) 1 % rectal ointment 1 application  1 application Rectal PRN Chelsea C Ward, MD      . diphenhydrAMINE (BENADRYL) capsule 25 mg  25 mg Oral Q6H PRN Chelsea C Ward, MD      . docusate sodium (COLACE) capsule 100 mg  100 mg Oral BID Chelsea C Ward, MD      . Influenza vac split quadrivalent PF (FLUARIX) injection 0.5 mL  0.5 mL Intramuscular Prior to discharge Elenora Fenderhelsea C Ward, MD      . lanolin ointment   Topical PRN Chelsea C Ward, MD      . ondansetron (ZOFRAN)  tablet 4 mg  4 mg Oral Q4H PRN Chelsea C Ward, MD      . oxyCODONE-acetaminophen (PERCOCET/ROXICET) 5-325 MG per tablet 1 tablet  1 tablet Oral Q4H PRN Chelsea C Ward, MD      . oxytocin (PITOCIN) IV infusion 40 units in LR 1000 mL  62.5 mL/hr Intravenous Continuous PRN Chelsea C Ward, MD      . prenatal multivitamin tablet 1 tablet  1 tablet Oral Q1200 Chelsea C Ward, MD      . simethicone (MYLICON) chewable tablet 80 mg  80 mg Oral PRN Elenora Fenderhelsea C Ward, MD       Facility-Administered Medications Ordered in Other Encounters  Medication Dose Route Frequency Provider Last Rate Last Dose  . bupivacaine (PF) (MARCAINE) 0.25 % injection    Anesthesia Intra-op Clovis FredricksonJennifer Crisson, CRNA   5 mL at 04/18/15 2203  . fentaNYL 2.5 mcg/ml w/ropivacaine 0.2% (preservative free) in normal saline 100 mL EPIDURAL Infusion in 150 ml Intravia Bag   Epidural Continuous PRN Clovis FredricksonJennifer Crisson, CRNA 10 mL/hr at 04/18/15 2218 10 mL/hr at 04/18/15 2218  . lidocaine-EPINEPHrine 1.5 %-1:200000 injection    Anesthesia  Intra-op Clovis Fredrickson, CRNA   2 mL at 04/18/15 2201      Recent Labs Lab 04/18/15 1955 04/19/15 0624  WBC 11.8* 17.1*  HGB 9.8* 9.5*  HCT 31.2* 30.4*  PLT 211 194    Assessment & Plan:  Pt doing well *Postpartum/postop: routine care -Pt NPO with MIVF going. Pt told that may be able to do ppBTL, which she is still sure that she wants (r/b/a d/w pt) done today, but given that it's Monday and it's an elective procedure, that may not be able to but will let her know if pushed back. Pt has private insurance *Social: SW already consulted *Dispo: likely PPD#1-2  A POS / Rubella Immune / Varicella Immune/  RPR pending / HIV negative / HepBsAg negative / Tdap UTD: Yes/Flu shot: ordered/ pap neg 2016 / Breast  / Contraception: ppBTL (see above) / Follow up: Johnsie Kindred. MD [facility] Pager [phone]

## 2015-04-19 NOTE — Op Note (Signed)
Operative Note   03/28/2015  PRE-OP DIAGNOSIS: Desire for permanent sterilization   POST-OP DIAGNOSIS: Same  SURGEON: Surgeon(s) and Role:    * [place] Bingharlie Klye Besecker, MD - Primary  ASSISTANT: None  ANESTHESIA: General and local  PROCEDURE: mini-laparotomy, bilateral tubal ligation via [place] method  ESTIMATED BLOOD LOSS: 10mL  DRAINS: none  TOTAL IV FLUIDS: 350mL crystalloid  SPECIMENS:  Portions of bilateral tubes to pathology  VTE PROPHYLAXIS: SCDs to the bilateral lower extremities  ANTIBIOTICS: not indicated  COMPLICATIONS: none  DISPOSITION: PACU - hemodynamically stable.  CONDITION: stable  FINDINGS: no intra-abdominal adhesions noted. Smooth, normally contoured uterine funds and bilateral tubes. Normal appearing tubes and normal ovaries on palpation. +lumens noted on each cross section  PROCEDURE IN DETAIL: The patient was taken to the OR where anesthesia was administed. The patient was positioned in dorsal supine. The patient was prepped and draped in the normal sterile fashion a 4cm horizontal incision was made approximately 3 finger breadths below the umbilicus, slightly below the level of the fundus, after injection with local anesthesia. The skin was then incised with the scalpel and the underlying tissue dissected with the bovie and the fascia grabbed with two hemostats and the peritoneum opened with the metzenbaum scissors; the corners were then tagged with suture.The abdomen was then entered bluntly and a moist lap sponge used to displace the bowel. The left Fallopian tube was palpated and then traced to it's fimbriae and an avascular midsection of the tube approximately 3-4cm from the cornua was grasped with the Babcock clamps and brought into a knuckle with the mesosalpingx viewed and the bovie used to make a defect. Both ends were then suture ligated with 2-0 vicryl and the intervening segment removed with the metzenbaum scissors. +lumens were noted and hemostasis  seen.  Attention was then turned to the right fallopian tube, after confirmation of identification by tracing the tube out to the fimbriae. The same procedure was then performed on the right Fallopian tube. The lap sponge was then removed from the abdomen and the fascia closed in running fashion with 0 vicryl, after removal of the suture tags and the subcutaneous tissue irrigated and closed with interrupted sutures of 3-0 plain gut. The skin was then closed with 4-0 vicryl and liquiband.   The patient tolerated the procedure well. All counts were correct x 2. The patient was transferred to the recovery room awake, alert and breathing independently.   Cornelia Copaharlie Joeanthony Seeling, Jr MD [facility]  Pager: [phone]

## 2015-04-19 NOTE — Discharge Summary (Signed)
Obstetrical Discharge Summary  Patient Name: Brenda Cohen DOB: 05-24-1986 MRN: [id number]  Date of Admission: 04/18/2015 Date of Discharge: 04/20/2015  Primary OB: [facility]  Gestational Age at Delivery: 5431w4d   Antepartum complications: anemia Admitting Diagnosis:  Rupture of membranes at term Secondary Diagnosis: anemia, depression Augmentation: Pitocin Complications: None Intrapartum complications/course: Patient presented with gross SROM and was contracting.  Progress stalled and pitocin was used for augmentation.  Patient received epidural.  Once complete had short second stage, with delivery of viable female.  No complications apparent. Date of Delivery: 04/19/15 Delivered By: Leeroy Bockhelsea Ward Delivery Type: spontaneous vaginal delivery Anesthesia: epidural Placenta: sponatneous Laceration: none Episiotomy: none Newborn Data: Live born female Birth Weight:   APGAR: ,     Discharge Physical Exam:  BP 126/80 mmHg  Pulse 79  Temp(Src) 98.3 F (36.8 C) (Oral)  Resp 18  Ht 5\' 1"  (1.549 m)  Wt 158 lb (71.668 kg)  BMI 29.87 kg/m2  SpO2 100%  LMP 07/16/2014  Breastfeeding? Unknown  General: NAD CV: RRR Pulm: CTABL, nl effort ABD: s/nd/nt, fundus firm and below the umbilicus Lochia: moderate Incision: c/d/i DVT Evaluation: LE non-ttp, no evidence of DVT on exam.  HEMOGLOBIN  Date Value Ref Range Status  04/19/2015 9.5* 12.0 - 16.0 g/dL Final   HGB  Date Value Ref Range Status  05/13/2014 13.7 12.0-16.0 g/dL Final   HCT  Date Value Ref Range Status  04/19/2015 30.4* 35.0 - 47.0 % Final  05/13/2014 41.6 35.0-47.0 % Final    Post partum course: patient kept NPO for ppBTL following delivery. Postpartum Procedures: post partum tubal ligation  Disposition: stable, discharge to home. Baby Feeding: breastmilk Baby Disposition: home with mom  Rh Immune globulin given: no Rubella vaccine given:no  Tdap vaccine given in AP or PP setting: antepartum Flu  vaccine given in AP or PP setting: offered post partum  Contraception: BTL  Prenatal Labs:  Blood type/Rh A+  Antibody screen neg  Rubella Immune  Varicella Immune  RPR NR  HBsAg Neg  HIV NR  GC neg  Chlamydia neg  Genetic screening negative  1 hour GTT 138  3 hour GTT --  GBS Neg 03/26/15      Pap: cytology neg    Plan:  Brenda Cohen was discharged to home in good condition. [facility] with Dr Elesa MassedWard in 2 weeks for close follow up due to depression.  Discharge Medications:   Medication List    STOP taking these medications        nitrofurantoin (macrocrystal-monohydrate) 100 MG capsule  Commonly known as:  MACROBID      TAKE these medications        oxyCODONE-acetaminophen 5-325 MG tablet  Commonly known as:  PERCOCET/ROXICET  Take 1 tablet by mouth every 4 (four) hours as needed (for pain scale 4-7).     sertraline 50 MG tablet  Commonly known as:  ZOLOFT  Take 1 tablet (50 mg total) by mouth daily.        Signed: Annamarie MajorPaul Damiya Sandefur, MD

## 2015-04-20 ENCOUNTER — Encounter: Payer: Self-pay | Admitting: Obstetrics and Gynecology

## 2015-04-20 LAB — SURGICAL PATHOLOGY

## 2015-04-20 LAB — RPR: RPR: NONREACTIVE

## 2015-04-20 MED ORDER — OXYCODONE-ACETAMINOPHEN 5-325 MG PO TABS: 1.0000 | ORAL_TABLET | ORAL | Status: DC | PRN

## 2015-04-20 MED ORDER — IBUPROFEN 400 MG PO TABS
400.0000 mg | ORAL_TABLET | Freq: Four times a day (QID) | ORAL | Status: DC | PRN
  Administered 2015-04-20: 400 mg via ORAL
  Filled 2015-04-20: qty 1

## 2015-04-20 MED ORDER — WHITE PETROLATUM GEL
Status: AC
Start: 2015-04-20 — End: 2015-04-20
  Filled 2015-04-20: qty 15

## 2015-04-20 NOTE — Anesthesia Postprocedure Evaluation (Signed)
  Anesthesia Post-op Note  Patient: Investment banker, corporateAmber M Cohen  Procedure(s) Performed: * No procedures listed *  Anesthesia type:Epidural  Patient location: 350  Post pain: Pain level controlled  Post assessment: Post-op Vital signs reviewed, Patient's Cardiovascular Status Stable, Respiratory Function Stable, Patent Airway and No signs of Nausea or vomiting  Post vital signs: Reviewed and stable  Last Vitals:  Filed Vitals:   04/20/15 0330  BP:   Pulse:   Temp: 36.8 C  Resp:     Level of consciousness: awake, alert  and patient cooperative  Complications: No apparent anesthesia complications

## 2015-04-20 NOTE — Progress Notes (Signed)
Daily Post Partum Note  Brenda Cohen is a 29 y.o. [id number] PPD#1 s/p  SVD/BTL.  Pregnancy c/b difficult social situation  24hr/overnight events:  None  Subjective:  Pt doing well, mild pain at incision.  Normal diet, amb.  Breast feeding  Objective:    Current Vital Signs 24h Vital Sign Ranges  T 98.3 F (36.8 C) Temp  Avg: 98.2 F (36.8 C)  Min: 97.7 F (36.5 C)  Max: 98.8 F (37.1 C)  BP 126/80 mmHg BP  Min: 92/58  Max: 126/80  HR 79 Pulse  Avg: 88  Min: 79  Max: 97  RR 18 Resp  Avg: 17.4  Min: 16  Max: 18  SaO2 100 % RA SpO2  Avg: 98.4 %  Min: 95 %  Max: 100 %       24 Hour I/O Current Shift I/O  Time Ins Outs 11/14 0701 - 11/15 0700 In: 150 [I.V.:150] Out: 900 [Urine:900]      General: NAD Abdomen: FF below the umbilicus, nttp, nd Perineum: deferred Skin:  Warm and dry.  Cardiovascular: Regular rate and rhythm. Respiratory:  Clear to auscultation bilateral. Normal respiratory effort Extremities: no c/c/e  Medications Current Facility-Administered Medications  Medication Dose Route Frequency Provider Last Rate Last Dose  . acetaminophen (TYLENOL) tablet 650 mg  650 mg Oral Q4H PRN Chelsea C Ward, MD      . benzocaine-Menthol (DERMOPLAST) 20-0.5 % topical spray 1 application  1 application Topical PRN Chelsea C Ward, MD      . witch hazel-glycerin (TUCKS) pad 1 application  1 application Topical PRN Chelsea C Ward, MD       And  . dibucaine (NUPERCAINAL) 1 % rectal ointment 1 application  1 application Rectal PRN Chelsea C Ward, MD      . diphenhydrAMINE (BENADRYL) capsule 25 mg  25 mg Oral Q6H PRN Chelsea C Ward, MD      . docusate sodium (COLACE) capsule 100 mg  100 mg Oral BID Elenora Fenderhelsea C Ward, MD   100 mg at 04/19/15 2337  . Influenza vac split quadrivalent PF (FLUARIX) injection 0.5 mL  0.5 mL Intramuscular Prior to discharge Chelsea C Ward, MD      . lanolin ointment   Topical PRN Elenora Fenderhelsea C Ward, MD      . ondansetron [facility](ZOFRAN) tablet 4 mg  4 mg Oral Q4H  PRN Chelsea C Ward, MD      . oxyCODONE-acetaminophen (PERCOCET/ROXICET) 5-325 MG per tablet 1 tablet  1 tablet Oral Q4H PRN Elenora Fenderhelsea C Ward, MD   1 tablet at 04/20/15 0517  . prenatal multivitamin tablet 1 tablet  1 tablet Oral Q1200 Elenora Fenderhelsea C Ward, MD   1 tablet at 04/19/15 1645  . sertraline (ZOLOFT) tablet 50 mg  50 mg Oral Daily [place] Bingharlie Pickens, MD      . simethicone (MYLICON) chewable tablet 80 mg  80 mg Oral PRN Chelsea C Ward, MD      . simethicone [facility](MYLICON) chewable tablet 80 mg  80 mg Oral BID [facility] [place] Bingharlie Pickens, MD   80 mg at 04/19/15 1645  . white petrolatum (VASELINE) gel               Recent Labs Lab 04/18/15 1955 04/19/15 0624  WBC 11.8* 17.1*  HGB 9.8* 9.5*  HCT 31.2* 30.4*  PLT 211 194    Assessment & Plan:  Pt doing well *Postpartum/postop: routine care *Dispo: likely PPD#1-2  A POS / Rubella Immune / Varicella Immune/  RPR pending / HIV negative / HepBsAg negative / Tdap UTD: Yes/Flu shot: ordered/ pap neg 2016 / Breast  / Contraception: ppBTL (see above) / Follow up: Salvatore Marvel, MD [facility] Pager [phone]

## 2015-04-29 NOTE — Final Progress Note (Signed)
Physician Final Progress Note  Patient ID: Brenda Cohen MRN: [id number] DOB/AGE: [birth year] 29 y.o.  Admit date: 04/10/2015 Admitting provider: Conard NovakStephen D Zenda Herskowitz, MD Discharge date: 04/10/2015  Admission Diagnoses:  * Supervision of normal pregnancy * labor and delivery, indication for care  Discharge Diagnoses:  * Supervision of normal pregnancy * labor and delivery, indication for care  Consults: None  Significant Findings/ Diagnostic Studies: none  Procedures: NST. Reactive  Discharge Condition: stable  Disposition: 01-Home or Self Care  Diet: Regular diet  Discharge Activity: Activity as tolerated     Medication List    ASK your doctor about these medications        sertraline 50 MG tablet  Commonly known as:  ZOLOFT  Take 1 tablet (50 mg total) by mouth daily.       Total time spent taking care of this patient: 15 minutes  Signed: Conard NovakJackson, Lorenza Winkleman D, MD 04/29/2015, 6:17 PM

## 2015-05-05 NOTE — Progress Notes (Signed)
As of 04-06-15 pt was still taking,  Reordered.

## 2015-07-08 ENCOUNTER — Ambulatory Visit: Payer: 59 | Admitting: Psychiatry

## 2015-09-16 IMAGING — US US OB LIMITED
1 series · 14 of 18 positions shown · non-contrast
Comparison: Pelvis ultrasound 12/26/2010

CLINICAL DATA: 29-year-old female with vaginal bleeding in the
second trimester of pregnancy. Estimated gestational age by LMP 18
weeks 5 days. Initial encounter.

EXAM:
LIMITED OBSTETRIC ULTRASOUND

[Series 1: us ob limited · 0.25mm/px · 14 of 18 slices shown]
[im 1/18]
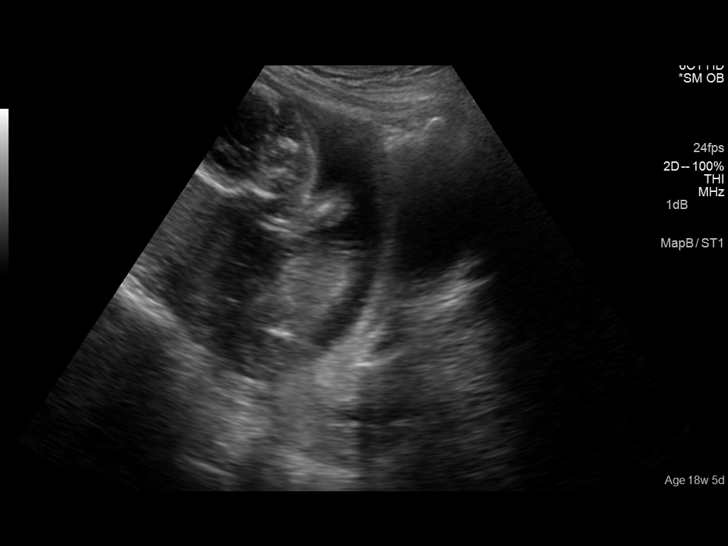
[im 2/18]
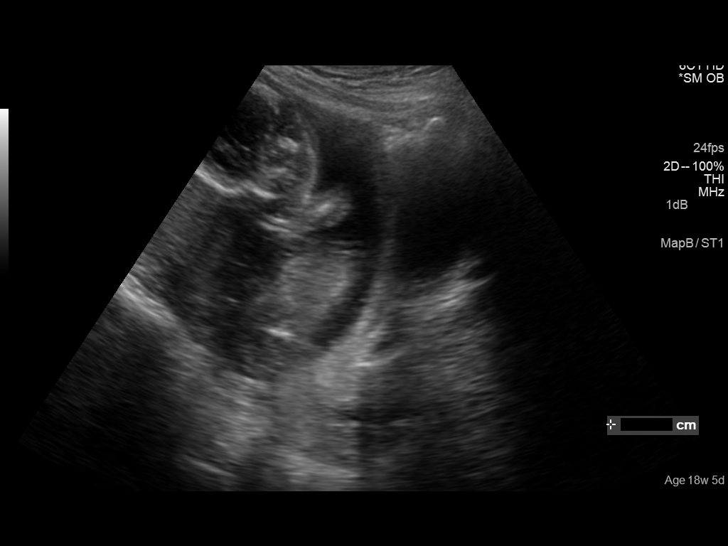
[im 4/18]
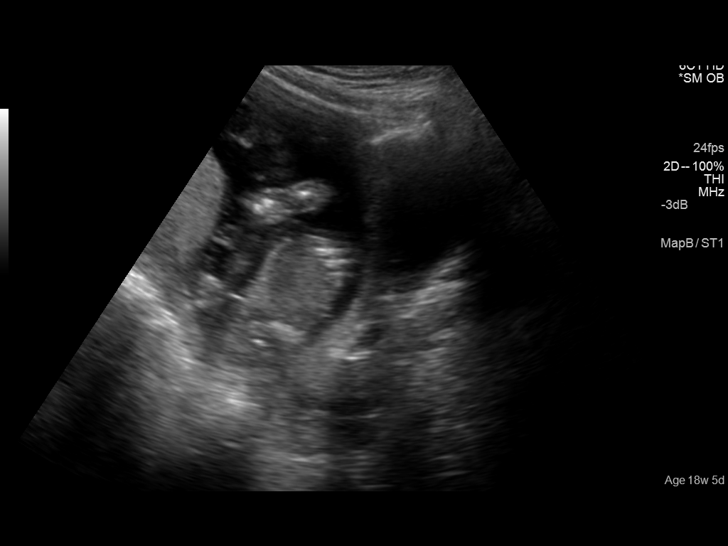
[im 5/18]
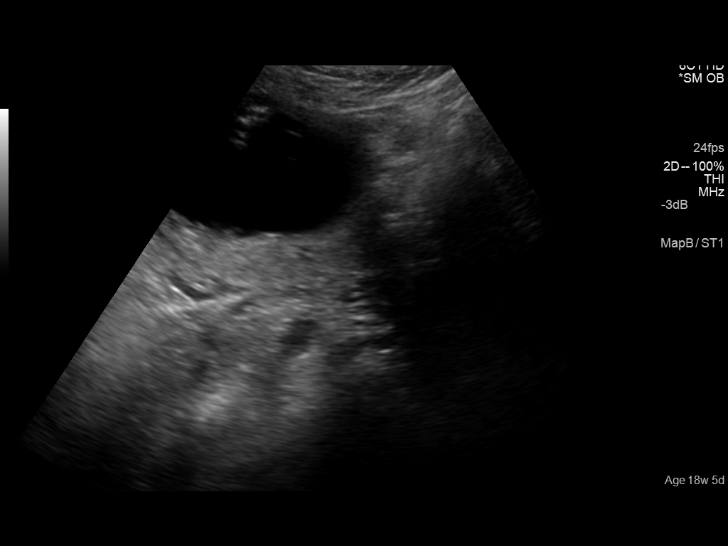
[im 6/18]
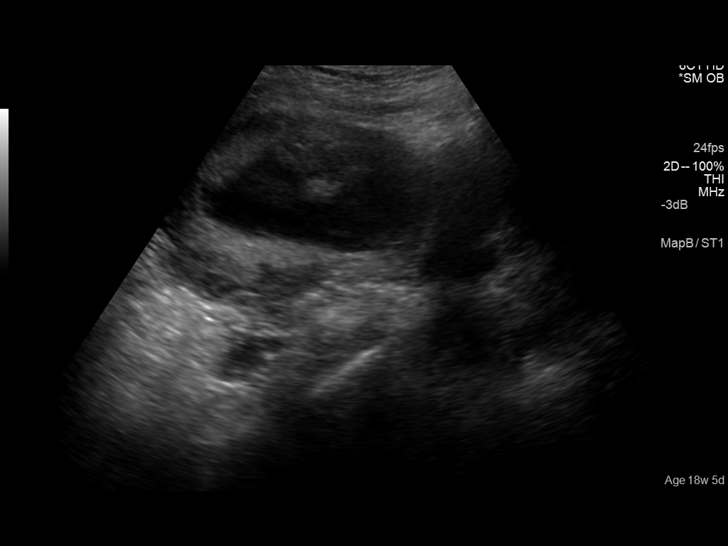
[im 8/18]
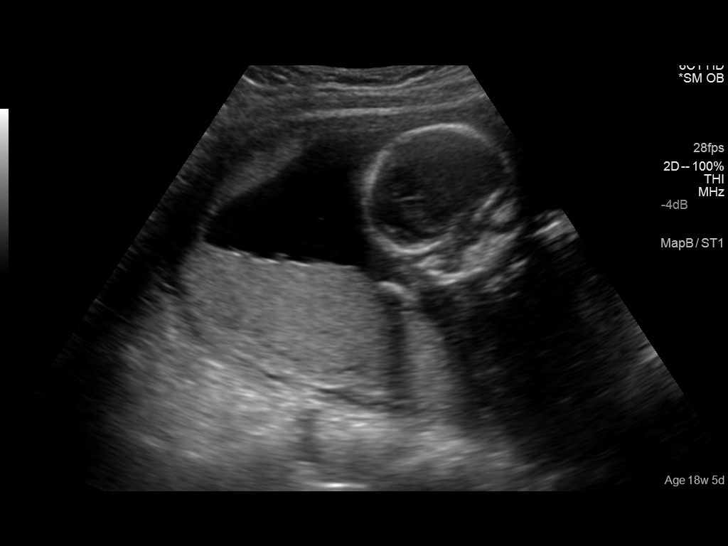
[im 9/18]
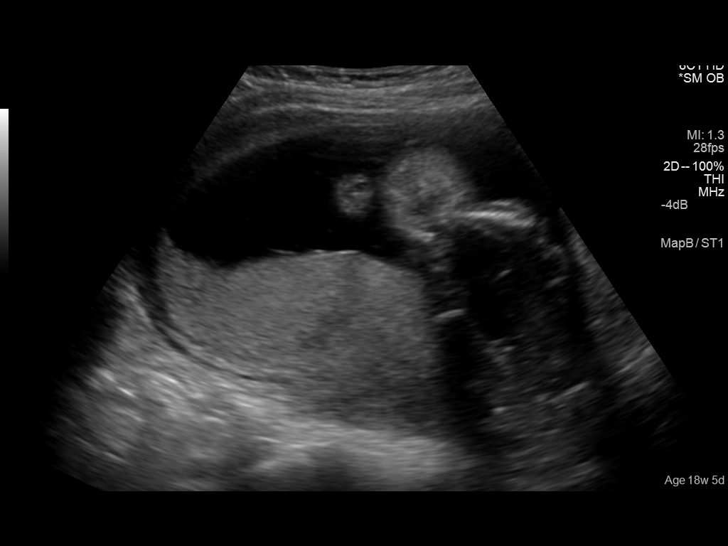
[im 10/18]
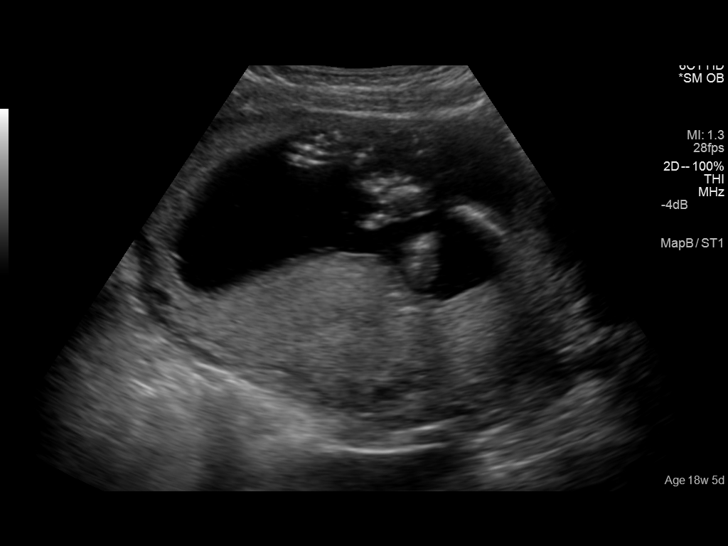
[im 11/18]
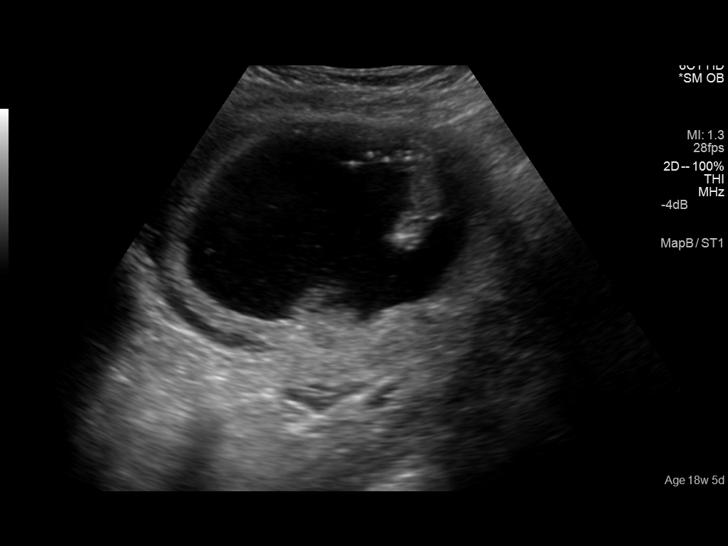
[im 13/18]
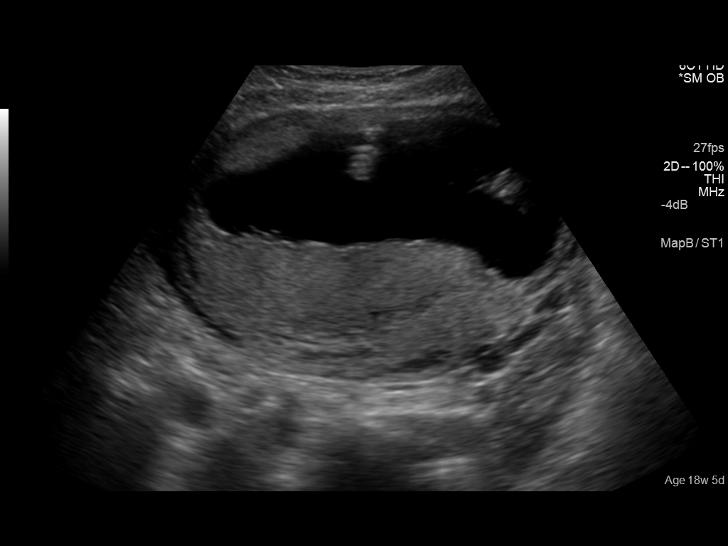
[im 14/18]
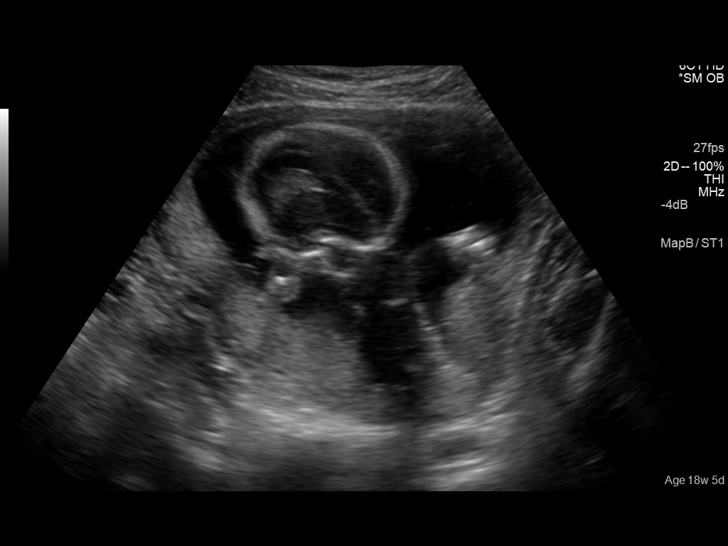
[im 15/18]
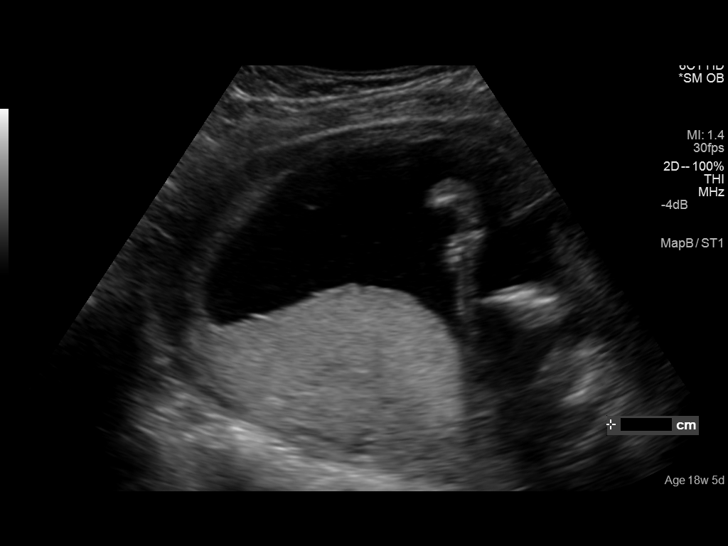
[im 17/18]
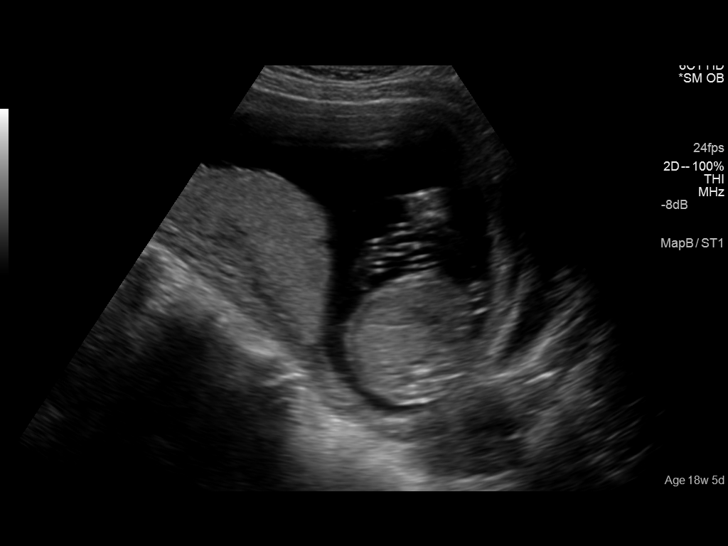
[im 18/18]
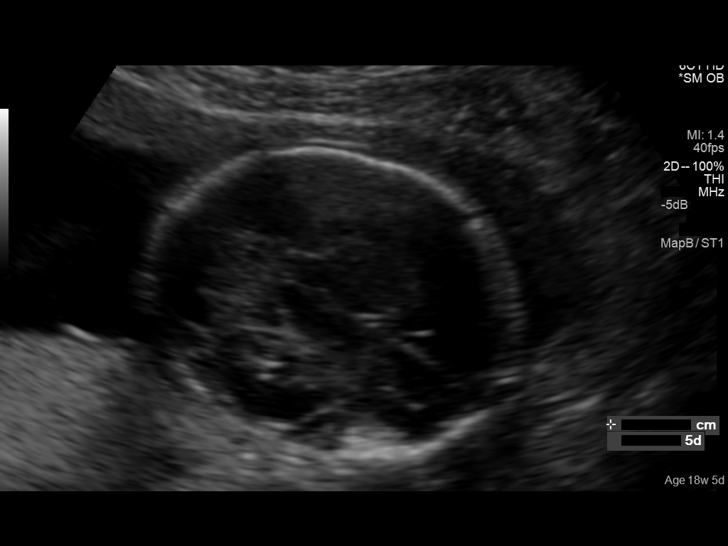

[14 of 18 positions shown; findings below may reference images not displayed]

FINDINGS: Number of Fetuses: 1

Heart Rate:  149 bpm

Movement: Yes

Presentation: Breech

Placental Location: Posterior

Previa: No

Amniotic Fluid (Subjective):  Within normal limits.

BPD:  4.2cm 18w  5d

MATERNAL FINDINGS:

Cervix:  Appears closed, length 4.3 cm.

Uterus/Adnexae:  No abnormality visualized.
IMPRESSION: Single living fetus.  No acute maternal findings visualized.

This exam is performed on an emergent basis and does not
comprehensively evaluate fetal size, dating, or anatomy; follow-up
complete OB US should be considered if further fetal assessment is
warranted.

## 2015-10-11 DIAGNOSIS — Z79899 Other long term (current) drug therapy: Secondary | ICD-10-CM | POA: Insufficient documentation

## 2015-10-11 DIAGNOSIS — F339 Major depressive disorder, recurrent, unspecified: Secondary | ICD-10-CM | POA: Diagnosis not present

## 2015-10-11 DIAGNOSIS — F1210 Cannabis abuse, uncomplicated: Secondary | ICD-10-CM | POA: Diagnosis not present

## 2015-10-11 DIAGNOSIS — R197 Diarrhea, unspecified: Secondary | ICD-10-CM | POA: Diagnosis not present

## 2015-10-11 DIAGNOSIS — R112 Nausea with vomiting, unspecified: Secondary | ICD-10-CM | POA: Diagnosis present

## 2015-10-11 LAB — BASIC METABOLIC PANEL
Anion gap: 11 (ref 5–15)
BUN: 14 mg/dL (ref 6–20)
CHLORIDE: 105 mmol/L (ref 101–111)
CO2: 25 mmol/L (ref 22–32)
CREATININE: 0.74 mg/dL (ref 0.44–1.00)
Calcium: 9.3 mg/dL (ref 8.9–10.3)
GFR calc Af Amer: >60 mL/min (ref >60)
GFR calc non Af Amer: >60 mL/min (ref >60)
GLUCOSE: 104 mg/dL — AB (ref 65–99)
Potassium: 4.2 mmol/L (ref 3.5–5.1)
SODIUM: 141 mmol/L (ref 135–145)

## 2015-10-11 LAB — CBC
HCT: 42.8 % (ref 35.0–47.0)
Hemoglobin: 14.5 g/dL (ref 12.0–16.0)
MCH: 28.8 pg (ref 26.0–34.0)
MCHC: 33.8 g/dL (ref 32.0–36.0)
MCV: 85 fL (ref 80.0–100.0)
Platelets: 229 10*3/uL (ref 150–440)
RBC: 5.03 MIL/uL (ref 3.80–5.20)
RDW: 14.5 % (ref 11.5–14.5)
WBC: 8.5 10*3/uL (ref 3.6–11.0)

## 2015-10-11 NOTE — ED Notes (Signed)
Patient to ED due to onset of vomiting and "flu like symptoms since 1400 hrs today. States she is breast feeding and needs to be able to keep fluids in for her baby. Unable to keep [place] down. Patient is well appearing in triage.

## 2015-10-12 ENCOUNTER — Emergency Department
Admission: EM | Admit: 2015-10-12 | Discharge: 2015-10-12 | Disposition: A | Discharge status: Home / Self Care | Payer: Medicaid Other | Attending: Emergency Medicine | Admitting: Emergency Medicine

## 2015-10-12 DIAGNOSIS — R112 Nausea with vomiting, unspecified: Secondary | ICD-10-CM

## 2015-10-12 DIAGNOSIS — R197 Diarrhea, unspecified: Secondary | ICD-10-CM

## 2015-10-12 LAB — URINALYSIS COMPLETE WITH MICROSCOPIC (ARMC ONLY)
BACTERIA UA: NONE SEEN
Bilirubin Urine: NEGATIVE
Glucose, UA: NEGATIVE mg/dL
Hgb urine dipstick: NEGATIVE
Leukocytes, UA: NEGATIVE
Nitrite: NEGATIVE
PH: 5 (ref 5.0–8.0)
PROTEIN: NEGATIVE mg/dL
Specific Gravity, Urine: 1.027 (ref 1.005–1.030)

## 2015-10-12 LAB — HEPATIC FUNCTION PANEL
ALBUMIN: 5 g/dL (ref 3.5–5.0)
ALT: 25 U/L (ref 14–54)
AST: 26 U/L (ref 15–41)
Alkaline Phosphatase: 91 U/L (ref 38–126)
Bilirubin, Direct: 0.1 mg/dL (ref 0.1–0.5)
Indirect Bilirubin: 1 mg/dL — ABNORMAL HIGH (ref 0.3–0.9)
Total Bilirubin: 1.1 mg/dL (ref 0.3–1.2)
Total Protein: 8.3 g/dL — ABNORMAL HIGH (ref 6.5–8.1)

## 2015-10-12 LAB — POCT PREGNANCY, URINE: PREG TEST UR: NEGATIVE

## 2015-10-12 LAB — LIPASE, BLOOD: Lipase: 23 U/L (ref 11–51)

## 2015-10-12 MED ORDER — ONDANSETRON 4 MG PO TBDP: 4.0000 mg | ORAL_TABLET | Freq: Three times a day (TID) | ORAL | Status: DC | PRN

## 2015-10-12 MED ORDER — SODIUM CHLORIDE 0.9 % IV BOLUS (SEPSIS)
1000.0000 mL | Freq: Once | INTRAVENOUS | Status: AC
  Administered 2015-10-12: 1000 mL via INTRAVENOUS

## 2015-10-12 MED ORDER — ONDANSETRON HCL 4 MG/2ML IJ SOLN
4.0000 mg | Freq: Once | INTRAMUSCULAR | Status: AC
  Administered 2015-10-12: 4 mg via INTRAVENOUS
  Filled 2015-10-12: qty 2

## 2015-10-12 NOTE — Discharge Instructions (Signed)
1. You may take nausea medicine as needed (Zofran #15). 2. Clear liquids 12 hours, then BRAT diet 3 days, then slowly advance diet as tolerated. 3. Return to the ER for worsening symptoms, persistent vomiting, difficult to breathing or other concerns.  Nausea and Vomiting Nausea is a sick feeling that often comes before throwing up (vomiting). Vomiting is a reflex where stomach contents come out of your mouth. Vomiting can cause severe loss of body fluids (dehydration). Children and elderly adults can become dehydrated quickly, especially if they also have diarrhea. Nausea and vomiting are symptoms of a condition or disease. It is important to find the cause of your symptoms. CAUSES   Direct irritation of the stomach lining. This irritation can result from increased acid production (gastroesophageal reflux disease), infection, food poisoning, taking certain medicines (such as nonsteroidal anti-inflammatory drugs), alcohol use, or tobacco use.  Signals from the brain.These signals could be caused by a headache, heat exposure, an inner ear disturbance, increased pressure in the brain from injury, infection, a tumor, or a concussion, pain, emotional stimulus, or metabolic problems.  An obstruction in the gastrointestinal tract (bowel obstruction).  Illnesses such as diabetes, hepatitis, gallbladder problems, appendicitis, kidney problems, cancer, sepsis, atypical symptoms of a heart attack, or eating disorders.  Medical treatments such as chemotherapy and radiation.  Receiving medicine that makes you sleep (general anesthetic) during surgery. DIAGNOSIS Your caregiver may ask for tests to be done if the problems do not improve after a few days. Tests may also be done if symptoms are severe or if the reason for the nausea and vomiting is not clear. Tests may include:  Urine tests.  Blood tests.  Stool tests.  Cultures (to look for evidence of infection).  X-rays or other imaging  studies. Test results can help your caregiver make decisions about treatment or the need for additional tests. TREATMENT You need to stay well hydrated. Drink frequently but in small amounts.You may wish to drink water, sports drinks, clear broth, or eat frozen ice pops or gelatin dessert to help stay hydrated.When you eat, eating slowly may help prevent nausea.There are also some antinausea medicines that may help prevent nausea. HOME CARE INSTRUCTIONS   Take all medicine as directed by your caregiver.  If you do not have an appetite, do not force yourself to eat. However, you must continue to drink fluids.  If you have an appetite, eat a normal diet unless your caregiver tells you differently.  Eat a variety of complex carbohydrates (rice, wheat, potatoes, bread), lean meats, yogurt, fruits, and vegetables.  Avoid high-fat foods because they are more difficult to digest.  Drink enough water and fluids to keep your urine clear or pale yellow.  If you are dehydrated, ask your caregiver for specific rehydration instructions. Signs of dehydration may include:  Severe thirst.  Dry lips and mouth.  Dizziness.  Dark urine.  Decreasing urine frequency and amount.  Confusion.  Rapid breathing or pulse. SEEK IMMEDIATE MEDICAL CARE IF:   You have blood or brown flecks (like coffee grounds) in your vomit.  You have black or bloody stools.  You have a severe headache or stiff neck.  You are confused.  You have severe abdominal pain.  You have chest pain or trouble breathing.  You do not urinate at least once every 8 hours.  You develop cold or clammy skin.  You continue to vomit for longer than 24 to 48 hours.  You have a fever. MAKE SURE YOU:  Understand these instructions.  Will watch your condition.  Will get help right away if you are not doing well or get worse.   This information is not intended to replace advice given to you by your health care provider.  Make sure you discuss any questions you have with your health care provider.   Document Released: 05/22/2005 Document Revised: 08/14/2011 Document Reviewed: 10/19/2010 Elsevier Interactive Patient Education 2016 [employer].  Diarrhea Diarrhea is frequent loose and watery bowel movements. It can cause you to feel weak and dehydrated. Dehydration can cause you to become tired and thirsty, have a dry mouth, and have decreased urination that often is dark yellow. Diarrhea is a sign of another problem, most often an infection that will not last long. In most cases, diarrhea typically lasts 2-3 days. However, it can last longer if it is a sign of something more serious. It is important to treat your diarrhea as directed by your caregiver to lessen or prevent future episodes of diarrhea. CAUSES  Some common causes include:  Gastrointestinal infections caused by viruses, bacteria, or parasites.  Food poisoning or food allergies.  Certain medicines, such as antibiotics, chemotherapy, and laxatives.  Artificial sweeteners and fructose.  Digestive disorders. HOME CARE INSTRUCTIONS  Ensure adequate fluid intake (hydration): Have 1 cup (8 oz) of fluid for each diarrhea episode. Avoid fluids that contain simple sugars or sports drinks, fruit juices, whole milk products, and sodas. Your urine should be clear or pale yellow if you are drinking enough fluids. Hydrate with an oral rehydration solution that you can purchase at pharmacies, retail stores, and online. You can prepare an oral rehydration solution at home by mixing the following ingredients together:   - tsp table salt.   tsp baking soda.   tsp salt substitute containing potassium chloride.  1  tablespoons sugar.  1 L (34 oz) of water.  Certain foods and beverages may increase the speed at which food moves through the gastrointestinal (GI) tract. These foods and beverages should be avoided and include:  Caffeinated and alcoholic  beverages.  High-fiber foods, such as raw fruits and vegetables, nuts, seeds, and whole grain breads and cereals.  Foods and beverages sweetened with sugar alcohols, such as xylitol, sorbitol, and mannitol.  Some foods may be well tolerated and may help thicken stool including:  Starchy foods, such as rice, toast, pasta, low-sugar cereal, oatmeal, grits, baked potatoes, crackers, and bagels.  Bananas.  Applesauce.  Add probiotic-rich foods to help increase healthy bacteria in the GI tract, such as yogurt and fermented milk products.  Wash your hands well after each diarrhea episode.  Only take over-the-counter or prescription medicines as directed by your caregiver.  Take a warm bath to relieve any burning or pain from frequent diarrhea episodes. SEEK IMMEDIATE MEDICAL CARE IF:   You are unable to keep fluids down.  You have persistent vomiting.  You have blood in your stool, or your stools are black and tarry.  You do not urinate in 6-8 hours, or there is only a small amount of very dark urine.  You have abdominal pain that increases or localizes.  You have weakness, dizziness, confusion, or light-headedness.  You have a severe headache.  Your diarrhea gets worse or does not get better.  You have a fever or persistent symptoms for more than 2-3 days.  You have a fever and your symptoms suddenly get worse. MAKE SURE YOU:   Understand these instructions.  Will watch your condition.  Will  get help right away if you are not doing well or get worse.   This information is not intended to replace advice given to you by your health care provider. Make sure you discuss any questions you have with your health care provider.   Document Released: 05/12/2002 Document Revised: 06/12/2014 Document Reviewed: 01/28/2012 Elsevier Interactive Patient Education 2016 [employer] Choices to Help Relieve Diarrhea, Adult When you have diarrhea, the foods you eat and your  eating habits are very important. Choosing the right foods and drinks can help relieve diarrhea. Also, because diarrhea can last up to 7 days, you need to replace lost fluids and electrolytes (such as sodium, potassium, and chloride) in order to help prevent dehydration.  WHAT GENERAL GUIDELINES DO I NEED TO FOLLOW?  Slowly drink 1 cup (8 oz) of fluid for each episode of diarrhea. If you are getting enough fluid, your urine will be clear or pale yellow.  Eat starchy foods. Some good choices include white rice, white toast, pasta, low-fiber cereal, baked potatoes (without the skin), saltine crackers, and bagels.  Avoid large servings of any cooked vegetables.  Limit fruit to two servings per day. A serving is  cup or 1 small piece.  Choose foods with less than 2 g of fiber per serving.  Limit fats to less than 8 tsp (38 g) per day.  Avoid fried foods.  Eat foods that have probiotics in them. Probiotics can be found in certain dairy products.  Avoid foods and beverages that may increase the speed at which food moves through the stomach and intestines (gastrointestinal tract). Things to avoid include:  High-fiber foods, such as dried fruit, raw fruits and vegetables, nuts, seeds, and whole grain foods.  Spicy foods and high-fat foods.  Foods and beverages sweetened with high-fructose corn syrup, honey, or sugar alcohols such as xylitol, sorbitol, and mannitol. WHAT FOODS ARE RECOMMENDED? Grains White rice. White, [place], or pita breads (fresh or toasted), including plain rolls, buns, or bagels. White pasta. Saltine, soda, or graham crackers. Pretzels. Low-fiber cereal. Cooked cereals made with water (such as cornmeal, farina, or cream cereals). Plain muffins. Matzo. Melba toast. [place].  Vegetables Potatoes (without the skin). Strained tomato and vegetable juices. Most well-cooked and canned vegetables without seeds. Tender lettuce. Fruits Cooked or canned applesauce, apricots,  cherries, fruit cocktail, grapefruit, peaches, pears, or plums. Fresh bananas, apples without skin, cherries, grapes, cantaloupe, grapefruit, peaches, oranges, or plums.  Meat and Other Protein Products Baked or boiled chicken. Eggs. Tofu. Fish. Seafood. Smooth peanut butter. Ground or well-cooked tender beef, ham, veal, lamb, pork, or poultry.  Dairy Plain yogurt, kefir, and unsweetened liquid yogurt. Lactose-free milk, buttermilk, or soy milk. Plain hard cheese. Beverages Sport drinks. Clear broths. Diluted fruit juices (except prune). Regular, caffeine-free sodas such as ginger ale. Water. Decaffeinated teas. Oral rehydration solutions. Sugar-free beverages not sweetened with sugar alcohols. Other Bouillon, broth, or soups made from recommended foods.  The items listed above may not be a complete list of recommended foods or beverages. Contact your dietitian for more options. WHAT FOODS ARE NOT RECOMMENDED? Grains Whole grain, whole wheat, bran, or rye breads, rolls, pastas, crackers, and cereals. Wild or brown rice. Cereals that contain more than 2 g of fiber per serving. Corn tortillas or taco shells. Cooked or dry oatmeal. Granola. Popcorn. Vegetables Raw vegetables. Cabbage, broccoli, Brussels sprouts, artichokes, baked beans, beet greens, corn, kale, legumes, peas, sweet potatoes, and yams. Potato skins. Cooked spinach and cabbage. Fruits Dried fruit,  including raisins and dates. Raw fruits. Stewed or dried prunes. Fresh apples with skin, apricots, mangoes, pears, raspberries, and strawberries.  Meat and Other Protein Products Chunky peanut butter. Nuts and seeds. Beans and lentils. Berniece Salines.  Dairy High-fat cheeses. Milk, chocolate milk, and beverages made with milk, such as milk shakes. Cream. Ice cream. Sweets and Desserts Sweet rolls, doughnuts, and sweet breads. Pancakes and waffles. Fats and Oils Butter. Cream sauces. Margarine. Salad oils. Plain salad dressings. Olives. Avocados.   Beverages Caffeinated beverages (such as coffee, tea, soda, or energy drinks). Alcoholic beverages. Fruit juices with pulp. Prune juice. Soft drinks sweetened with high-fructose corn syrup or sugar alcohols. Other Coconut. Hot sauce. Chili powder. Mayonnaise. Gravy. Cream-based or milk-based soups.  The items listed above may not be a complete list of foods and beverages to avoid. Contact your dietitian for more information. WHAT SHOULD I DO IF I BECOME DEHYDRATED? Diarrhea can sometimes lead to dehydration. Signs of dehydration include dark urine and dry mouth and skin. If you think you are dehydrated, you should rehydrate with an oral rehydration solution. These solutions can be purchased at pharmacies, retail stores, or online.  Drink -1 cup (120-240 mL) of oral rehydration solution each time you have an episode of diarrhea. If drinking this amount makes your diarrhea worse, try drinking smaller amounts more often. For example, drink 1-3 tsp (5-15 mL) every 5-10 minutes.  A general rule for staying hydrated is to drink 1-2 L of fluid per day. Talk to your health care provider about the specific amount you should be drinking each day. Drink enough fluids to keep your urine clear or pale yellow.   This information is not intended to replace advice given to you by your health care provider. Make sure you discuss any questions you have with your health care provider.   Document Released: 08/12/2003 Document Revised: 06/12/2014 Document Reviewed: 04/14/2013 Elsevier Interactive Patient Education [employer].

## 2015-10-12 NOTE — ED Notes (Signed)
Discharge instructions reviewed with patient. Patient verbalized understanding. Patient ambulated to lobby without difficulty.   

## 2015-10-12 NOTE — ED Provider Notes (Signed)
[facility] Emergency Department Provider Note   ____________________________________________  Time seen: Approximately 12:52 AM  I have reviewed the triage vital signs and the nursing notes.   HISTORY  Chief Complaint Emesis    HPI Brenda Cohen is a 30 y.o. female who presents to the ED from home with a chief complain of nausea, vomiting and diarrhea. Patient is 6 months postpartum, breast-feeding, who experienced onset of simultaneous vomiting and diarrhea since 2 PM. Vomited 2; several bouts of diarrhea. Last emesis approximately 8 PM. States her temperature was 101F at home; no antipyretics taken. Also complaining of myalgias. Denies chills, cough, chest pain, shortness of breath, breasts tenderness or warmth, abdominal pain, dysuria. No sick contacts. Denies recent travel or trauma.Nothing makes her symptoms better or worse.   Past Medical History  Diagnosis Date  . Anxiety   . Depression     Patient Active Problem List   Diagnosis Date Noted  . Normal vaginal delivery 04/19/2015  . Labor and delivery indication for care or intervention 04/18/2015  . Labor and delivery, indication for care 04/10/2015  . Irregular contractions 03/29/2015  . Indication for care in labor or delivery 03/10/2015  . MDD (major depressive disorder), recurrent episode, mild (HCC) 01/08/2015  . Anxiety state 01/08/2015  . Major depressive disorder, recurrent episode, mild (HCC) 12/17/2014    Past Surgical History  Procedure Laterality Date  . Wisdom tooth extraction    . Tubal ligation Bilateral 04/19/2015    Procedure: POST PARTUM TUBAL LIGATION;  Surgeon: [place] Bingharlie Pickens, MD;  Location: [facility] ORS;  Service: Gynecology;  Laterality: Bilateral;    Current Outpatient Rx  Name  Route  Sig  Dispense  Refill  . oxyCODONE-acetaminophen (PERCOCET/ROXICET) 5-325 MG tablet   Oral   Take 1 tablet by mouth every 4 (four) hours as needed (for pain scale 4-7).   35  tablet   0   . sertraline (ZOLOFT) 50 MG tablet   Oral   Take 1 tablet (50 mg total) by mouth daily.   90 tablet   1     Allergies Review of patient's allergies indicates no known allergies.  Family History  Problem Relation Age of Onset  . Drug abuse Mother   . Hyperlipidemia Father   . Hypertension Father   . Depression Father   . Migraines Sister   . Bipolar disorder Paternal Uncle   . Schizophrenia Paternal Uncle   . OCD Paternal Uncle     Social History Social History  Substance Use Topics  . Smoking status: Never Smoker   . Smokeless tobacco: Never Used  . Alcohol Use: 0.6 oz/week    1 Glasses of wine per week    Review of Systems  Constitutional: Positive for fever. Negative for chills. Eyes: No visual changes. ENT: No sore throat. Cardiovascular: Denies chest pain. Respiratory: Denies shortness of breath. Gastrointestinal: No abdominal pain.  Positive for nausea, vomiting and diarrhea.  No constipation. Genitourinary: Negative for dysuria. Musculoskeletal: Negative for back pain. Skin: Negative for rash. Neurological: Negative for headaches, focal weakness or numbness.  10-point ROS otherwise negative.  ____________________________________________   PHYSICAL EXAM:  VITAL SIGNS: ED Triage Vitals  Enc Vitals Group     BP 10/11/15 2216 117/81 mmHg     Pulse Rate 10/11/15 2216 104     Resp 10/11/15 2216 18     Temp 10/11/15 2216 99.5 F (37.5 C)     Temp Source 10/11/15 2216 Oral  SpO2 10/11/15 2216 96 %     Weight 10/11/15 2216 135 lb (61.236 kg)     Height 10/11/15 2216  (1.549 m)     Head Cir --      Peak Flow --      Pain Score 10/11/15 2217 5     Pain Loc --      Pain Edu? --      Excl. in GC? --     Constitutional: Alert and oriented. Well appearing and in no acute distress. Eyes: Conjunctivae are normal. PERRL. EOMI. Head: Atraumatic. Nose: No congestion/rhinnorhea. Mouth/Throat: Mucous membranes are moist.  Oropharynx  non-erythematous. Neck: No stridor.  Supple neck without meningismus. Cardiovascular: Normal rate, regular rhythm. Grossly normal heart sounds.  Good peripheral circulation. Respiratory: Normal respiratory effort.  No retractions. Lungs CTAB. Gastrointestinal: Soft and nontender to light or deep palpation. No distention. No abdominal bruits. No CVA tenderness. Musculoskeletal: No lower extremity tenderness nor edema.  No joint effusions. Neurologic:  Normal speech and language. No gross focal neurologic deficits are appreciated. No gait instability. Skin:  Skin is warm, dry and intact. No rash noted. Psychiatric: Mood and affect are normal. Speech and behavior are normal.  ____________________________________________   LABS (all labs ordered are listed, but only abnormal results are displayed)  Labs Reviewed  BASIC METABOLIC PANEL - Abnormal; Notable for the following:    Glucose, Bld 104 (*)    All other components within normal limits  CBC  URINALYSIS COMPLETEWITH MICROSCOPIC ([facility] ONLY)  POC URINE PREG, ED   ____________________________________________  EKG  None ____________________________________________  RADIOLOGY  None ____________________________________________   PROCEDURES  Procedure(s) performed: None  Critical Care performed: No  ____________________________________________   INITIAL IMPRESSION / ASSESSMENT AND PLAN / ED COURSE  Pertinent labs & imaging results that were available during my care of the patient were reviewed by me and considered in my medical decision making (see chart for details).  30 year old female who presents with nausea, vomiting and diarrhea. Nontender abdomen on exam. Will initiate IV fluid resuscitation, IV antiemetic; added LFTs and lipase to labs drawn in triage. Will also obtain urinalysis.  ----------------------------------------- 4:10 AM on 10/12/2015 -----------------------------------------  Updated patient of  urinalysis results. Patient tolerated ginger ale without emesis. Will prescribe ODT Zofran and patient will follow-up with her PCP this week. Strict return precautions given. Patient verbalizes understanding and agrees with plan of care. Of note, patient spoke with her husband who now has similar symptoms. ____________________________________________   FINAL CLINICAL IMPRESSION(S) / ED DIAGNOSES  Final diagnoses:  Nausea vomiting and diarrhea      NEW MEDICATIONS STARTED DURING THIS VISIT:  New Prescriptions   No medications on file     Note:  This document was prepared using Dragon voice recognition software and may include unintentional dictation errors.    Irean Hong, MD 10/12/15 [phone]

## 2015-12-26 ENCOUNTER — Encounter: Payer: Self-pay | Admitting: *Deleted

## 2015-12-26 ENCOUNTER — Emergency Department
Admission: EM | Admit: 2015-12-26 | Discharge: 2015-12-26 | Disposition: A | Discharge status: Home / Self Care | Payer: Medicaid Other | Attending: Emergency Medicine | Admitting: Emergency Medicine

## 2015-12-26 DIAGNOSIS — A4902 Methicillin resistant Staphylococcus aureus infection, unspecified site: Secondary | ICD-10-CM | POA: Diagnosis not present

## 2015-12-26 DIAGNOSIS — B958 Unspecified staphylococcus as the cause of diseases classified elsewhere: Secondary | ICD-10-CM

## 2015-12-26 DIAGNOSIS — F1290 Cannabis use, unspecified, uncomplicated: Secondary | ICD-10-CM | POA: Insufficient documentation

## 2015-12-26 DIAGNOSIS — R21 Rash and other nonspecific skin eruption: Secondary | ICD-10-CM | POA: Diagnosis present

## 2015-12-26 DIAGNOSIS — L089 Local infection of the skin and subcutaneous tissue, unspecified: Secondary | ICD-10-CM

## 2015-12-26 MED ORDER — SULFAMETHOXAZOLE-TRIMETHOPRIM 800-160 MG PO TABS: 1.0000 | ORAL_TABLET | Freq: Two times a day (BID) | ORAL | 0 refills | Status: DC

## 2015-12-26 NOTE — ED Provider Notes (Signed)
[facility] Emergency Department Provider Note  ____________________________________________  Time seen: Approximately 3:43 PM  I have reviewed the triage vital signs and the nursing notes.   HISTORY  Chief Complaint No chief complaint on file.   HPI Brenda Cohen is a 30 y.o. female who presents to the emergency department for evaluation of skin lesions. She states that last week her son was diagnosed with staph and MRSA and now she and another child have the same symptoms. She states that she noticed a small bump on her left cheek a couple of days ago and it has gotten larger. She also feels that they are starting to come up on her arms.   Past Medical History:  Diagnosis Date  . Anxiety   . Depression     Patient Active Problem List   Diagnosis Date Noted  . Normal vaginal delivery 04/19/2015  . Labor and delivery indication for care or intervention 04/18/2015  . Labor and delivery, indication for care 04/10/2015  . Irregular contractions 03/29/2015  . Indication for care in labor or delivery 03/10/2015  . MDD (major depressive disorder), recurrent episode, mild (HCC) 01/08/2015  . Anxiety state 01/08/2015  . Major depressive disorder, recurrent episode, mild (HCC) 12/17/2014    Past Surgical History:  Procedure Laterality Date  . TUBAL LIGATION Bilateral 04/19/2015   Procedure: POST PARTUM TUBAL LIGATION;  Surgeon: [place] Bing, MD;  Location: [facility] ORS;  Service: Gynecology;  Laterality: Bilateral;  . WISDOM TOOTH EXTRACTION      Current Outpatient Rx  . Order #: [id number] Class: Print  . Order #: [id number] Class: Print  . Order #: [id number] Class: Normal  . Order #: [id number] Class: Print    Allergies Review of patient's allergies indicates no known allergies.  Family History  Problem Relation Age of Onset  . Drug abuse Mother   . Hyperlipidemia Father   . Hypertension Father   . Depression Father   . Migraines Sister   .  Bipolar disorder Paternal Uncle   . Schizophrenia Paternal Uncle   . OCD Paternal Uncle     Social History Social History  Substance Use Topics  . Smoking status: Never Smoker  . Smokeless tobacco: Never Used  . Alcohol use 0.6 oz/week    1 Glasses of wine per week    Review of Systems  Constitutional: Negative for fever/chills Respiratory: Negative for shortness of breath. Musculoskeletal: Negative for pain. Skin: Positive for lesions. Neurological: Negative for headaches, focal weakness or numbness. ____________________________________________   PHYSICAL EXAM:  VITAL SIGNS: ED Triage Vitals  Enc Vitals Group     BP      Pulse      Resp      Temp      Temp src      SpO2      Weight      Height      Head Circumference      Peak Flow      Pain Score      Pain Loc      Pain Edu?      Excl. in GC?      Constitutional: Alert and oriented. Well appearing and in no acute distress. Eyes: Conjunctivae are normal. EOMI. Nose: No congestion/rhinnorhea. Mouth/Throat: Mucous membranes are moist.   Neck: No stridor. Cardiovascular: Good peripheral circulation. Respiratory: Normal respiratory effort.  No retractions. Musculoskeletal: FROM throughout. Neurologic:  Normal speech and language. No gross focal neurologic deficits are appreciated. Skin:  1cm Crusted lesion with  an erythematous base noted to the right cheek. Small erythematous maculopapular lesions noted to the bilateral forearms.  ____________________________________________   LABS (all labs ordered are listed, but only abnormal results are displayed)  Labs Reviewed - No data to display ____________________________________________  EKG   ____________________________________________  RADIOLOGY   ____________________________________________   PROCEDURES  Procedure(s) performed: None ____________________________________________   INITIAL IMPRESSION / ASSESSMENT AND PLAN / ED  COURSE  Pertinent labs & imaging results that were available during my care of the patient were reviewed by me and considered in my medical decision making (see chart for details).  She will be advised to take Bactrim as prescribed until finished.  She was advised to follow up with primary care provider for choice for symptoms not improving over the next 3-5 days.  She was also advised to return to the emergency department for symptoms that change or worsen if unable to schedule an appointment.  ____________________________________________   FINAL CLINICAL IMPRESSION(S) / ED DIAGNOSES  Final diagnoses:  Staph skin infection    New Prescriptions   SULFAMETHOXAZOLE-TRIMETHOPRIM (BACTRIM DS,SEPTRA DS) 800-160 MG TABLET    Take 1 tablet by mouth 2 (two) times daily.      Chinita Pester, FNP 12/26/15 1554    Rockne Menghini, MD 12/28/15 [phone]

## 2015-12-26 NOTE — ED Notes (Signed)
Scattered pustules

## 2015-12-26 NOTE — ED Triage Notes (Signed)
Patient states she has had a rash for 4 days. Patient states her sons have had them for 2 weeks.

## 2015-12-31 ENCOUNTER — Emergency Department
Admission: EM | Admit: 2015-12-31 | Discharge: 2015-12-31 | Disposition: A | Discharge status: Home / Self Care | Payer: Medicaid Other | Attending: Emergency Medicine | Admitting: Emergency Medicine

## 2015-12-31 ENCOUNTER — Emergency Department: Payer: Medicaid Other

## 2015-12-31 ENCOUNTER — Encounter: Payer: Self-pay | Admitting: Emergency Medicine

## 2015-12-31 DIAGNOSIS — Z5181 Encounter for therapeutic drug level monitoring: Secondary | ICD-10-CM | POA: Insufficient documentation

## 2015-12-31 DIAGNOSIS — R509 Fever, unspecified: Secondary | ICD-10-CM | POA: Insufficient documentation

## 2015-12-31 LAB — CBC WITH DIFFERENTIAL/PLATELET
BASOS ABS: 0 10*3/uL (ref 0–0.1)
BASOS PCT: 0 %
Eosinophils Absolute: 0.3 10*3/uL (ref 0–0.7)
Eosinophils Relative: 7 %
HEMATOCRIT: 39.7 % (ref 35.0–47.0)
HEMOGLOBIN: 13.7 g/dL (ref 12.0–16.0)
Lymphocytes Relative: 11 %
Lymphs Abs: 0.4 10*3/uL — ABNORMAL LOW (ref 1.0–3.6)
MCH: 29.9 pg (ref 26.0–34.0)
MCHC: 34.4 g/dL (ref 32.0–36.0)
MCV: 87 fL (ref 80.0–100.0)
Monocytes Absolute: 0.4 10*3/uL (ref 0.2–0.9)
Monocytes Relative: 12 %
NEUTROS ABS: 2.5 10*3/uL (ref 1.4–6.5)
NEUTROS PCT: 70 %
Platelets: 166 10*3/uL (ref 150–440)
RBC: 4.56 MIL/uL (ref 3.80–5.20)
RDW: 14.7 % — ABNORMAL HIGH (ref 11.5–14.5)
WBC: 3.6 10*3/uL (ref 3.6–11.0)

## 2015-12-31 LAB — COMPREHENSIVE METABOLIC PANEL
ALT: 21 U/L (ref 14–54)
AST: 26 U/L (ref 15–41)
Albumin: 4.6 g/dL (ref 3.5–5.0)
Alkaline Phosphatase: 93 U/L (ref 38–126)
Anion gap: 9 (ref 5–15)
BUN: 12 mg/dL (ref 6–20)
CHLORIDE: 101 mmol/L (ref 101–111)
CO2: 23 mmol/L (ref 22–32)
Calcium: 9.2 mg/dL (ref 8.9–10.3)
Creatinine, Ser: 0.83 mg/dL (ref 0.44–1.00)
Glucose, Bld: 88 mg/dL (ref 65–99)
POTASSIUM: 4.2 mmol/L (ref 3.5–5.1)
Sodium: 133 mmol/L — ABNORMAL LOW (ref 135–145)
Total Bilirubin: 0.6 mg/dL (ref 0.3–1.2)
Total Protein: 7.6 g/dL (ref 6.5–8.1)

## 2015-12-31 LAB — URINALYSIS COMPLETE WITH MICROSCOPIC (ARMC ONLY)
Bilirubin Urine: NEGATIVE
Glucose, UA: NEGATIVE mg/dL
HGB URINE DIPSTICK: NEGATIVE
NITRITE: NEGATIVE
PROTEIN: NEGATIVE mg/dL
SPECIFIC GRAVITY, URINE: 1.018 (ref 1.005–1.030)
pH: 5 (ref 5.0–8.0)

## 2015-12-31 LAB — HCG, QUANTITATIVE, PREGNANCY

## 2015-12-31 LAB — LACTIC ACID, PLASMA
LACTIC ACID, VENOUS: 1.1 mmol/L (ref 0.5–1.9)
LACTIC ACID, VENOUS: 1 mmol/L (ref 0.5–1.9)

## 2015-12-31 MED ORDER — SODIUM CHLORIDE 0.9 % IV BOLUS (SEPSIS)
30.0000 mL/kg | Freq: Once | INTRAVENOUS | Status: AC
  Administered 2015-12-31: 1770 mL via INTRAVENOUS

## 2015-12-31 NOTE — ED Provider Notes (Signed)
[facility] Emergency Department Provider Note        Time seen: ----------------------------------------- 3:28 PM on 12/31/2015 -----------------------------------------    I have reviewed the triage vital signs and the nursing notes.   HISTORY  Chief Complaint Fever    HPI Brenda Cohen is a 30 y.o. female who presents to ER for fever, nausea, abdominal pain and weakness. Recently she been treatedfor skin lesions with Septra. She's been on this for almost a week. Patient states she has been breast-feeding her child, denies IV drug use, denies any skin lesions and states the previous skin lesions have resolved. She denies vomiting or diarrhea, denies sore throat or cough. Patient denies tick exposure.   Past Medical History:  Diagnosis Date  . Anxiety   . Depression     Patient Active Problem List   Diagnosis Date Noted  . Normal vaginal delivery 04/19/2015  . Labor and delivery indication for care or intervention 04/18/2015  . Labor and delivery, indication for care 04/10/2015  . Irregular contractions 03/29/2015  . Indication for care in labor or delivery 03/10/2015  . MDD (major depressive disorder), recurrent episode, mild (HCC) 01/08/2015  . Anxiety state 01/08/2015  . Major depressive disorder, recurrent episode, mild (HCC) 12/17/2014    Past Surgical History:  Procedure Laterality Date  . TUBAL LIGATION Bilateral 04/19/2015   Procedure: POST PARTUM TUBAL LIGATION;  Surgeon: [place] Bing, MD;  Location: [facility] ORS;  Service: Gynecology;  Laterality: Bilateral;  . WISDOM TOOTH EXTRACTION      Allergies Review of patient's allergies indicates no known allergies.  Social History Social History  Substance Use Topics  . Smoking status: Never Smoker  . Smokeless tobacco: Never Used  . Alcohol use 0.6 oz/week    1 Glasses of wine per week    Review of Systems Constitutional:Positive for fever Eyes: Negative for visual  changes. ENT: Negative for sore throat. Cardiovascular: Negative for chest pain. Respiratory: Negative for shortness of breath. Gastrointestinal: Negative for abdominal pain, vomiting and diarrhea. Genitourinary: Negative for dysuria. Musculoskeletal: Negative for back pain. Skin: Negative for rash. Neurological: Negative for headaches, focal weakness or numbness.  10-point ROS otherwise negative.  ____________________________________________   PHYSICAL EXAM:  VITAL SIGNS: ED Triage Vitals  Enc Vitals Group     BP 12/31/15 1305 120/74     Pulse Rate 12/31/15 1305 (!) 125     Resp 12/31/15 1305 20     Temp 12/31/15 1305 (!) 103.2 F (39.6 C)     Temp Source 12/31/15 1305 Oral     SpO2 12/31/15 1305 98 %     Weight 12/31/15 1305 130 lb (59 kg)     Height 12/31/15 1305 5\' 2"  (1.575 m)     Head Circumference --      Peak Flow --      Pain Score 12/31/15 1306 7     Pain Loc --      Pain Edu? --      Excl. in GC? --     Constitutional: Alert and oriented. Well appearing and in no distress. Eyes: Conjunctivae are normal. PERRL. Normal extraocular movements. ENT   Head: Normocephalic and atraumatic.   Nose: No congestion/rhinnorhea.   Mouth/Throat: Mucous membranes are moist.   Neck: No stridor. Cardiovascular: Normal rate, regular rhythm. No murmurs, rubs, or gallops. Respiratory: Normal respiratory effort without tachypnea nor retractions. Breath sounds are clear and equal bilaterally. No wheezes/rales/rhonchi. Gastrointestinal: Soft and nontender. Normal bowel sounds Musculoskeletal: Nontender with  normal range of motion in all extremities. No lower extremity tenderness nor edema. Neurologic:  Normal speech and language. No gross focal neurologic deficits are appreciated.  Skin:  Skin is warm, dry and intact. No rash noted.Breasts appear normal Psychiatric: Mood and affect are normal. Speech and behavior are normal.   ____________________________________________  EKG: Interpreted by me. Sinus tachycardia with rate of 121 bpm, normal PR interval, normal QRS, normal QT interval. T-wave inversions are noted.  ____________________________________________  ED COURSE:  Pertinent labs & imaging results that were available during my care of the patient were reviewed by me and considered in my medical decision making (see chart for details). Patient presents to ER with fever of unknown origin. I will check basic labs and lactate. ____________________________________________    LABS (pertinent positives/negatives)  Labs Reviewed  COMPREHENSIVE METABOLIC PANEL - Abnormal; Notable for the following:       Result Value   Sodium 133 (*)    All other components within normal limits  CBC WITH DIFFERENTIAL/PLATELET - Abnormal; Notable for the following:    RDW 14.7 (*)    Lymphs Abs 0.4 (*)    All other components within normal limits  URINALYSIS COMPLETEWITH MICROSCOPIC ([facility] ONLY) - Abnormal; Notable for the following:    Color, Urine YELLOW (*)    APPearance HAZY (*)    Ketones, ur TRACE (*)    Leukocytes, UA TRACE (*)    Bacteria, UA RARE (*)    Squamous Epithelial / LPF 6-30 (*)    All other components within normal limits  CULTURE, BLOOD (ROUTINE X 2)  CULTURE, BLOOD (ROUTINE X 2)  URINE CULTURE  HCG, QUANTITATIVE, PREGNANCY  LACTIC ACID, PLASMA    RADIOLOGY  Chest x-ray Is unremarkable ____________________________________________  FINAL ASSESSMENT AND PLAN  Fever of unknown origin  Plan: Patient with labs and imaging as dictated above. Patient is in no acute distress, serial lactates have been negative. I cannot find a source for her fever. I will advise follow-up in 24 hours with her doctor for recheck.   Emily Filbert, MD   Note: This dictation was prepared with Dragon dictation. Any transcriptional errors that result from this process are unintentional    Emily Filbert, MD 12/31/15 [phone]

## 2015-12-31 NOTE — ED Triage Notes (Signed)
Pt c/o fever, nausea, upper abdominal pain, general weakness. On abx for staph infection already.

## 2015-12-31 NOTE — ED Triage Notes (Addendum)
Dr Pershing Proud and first RN aware of pt. Code sepsis per dr Pershing Proud.

## 2015-12-31 NOTE — ED Notes (Signed)
Spoke to Dr. Mayford Knife about patient status regarding code sepsis. Per Dr. Mayford Knife, cancelling code sepsis. ED secretary made aware.

## 2015-12-31 NOTE — ED Notes (Signed)
Patient states she began feeling unwell last night. States she took Tylenol, fever resolved after 3 hours, returned at 4 hour mark. Patient thought she may have had food poisoning d/t nausea. Recently given prophylaxis antibiotics for staph infection that her sons had developed.

## 2016-01-01 LAB — URINE CULTURE

## 2016-01-02 ENCOUNTER — Encounter: Payer: Self-pay | Admitting: Emergency Medicine

## 2016-01-02 ENCOUNTER — Emergency Department
Admission: EM | Admit: 2016-01-02 | Discharge: 2016-01-02 | Disposition: A | Discharge status: Home / Self Care | Payer: Medicaid Other | Attending: Emergency Medicine | Admitting: Emergency Medicine

## 2016-01-02 DIAGNOSIS — F330 Major depressive disorder, recurrent, mild: Secondary | ICD-10-CM | POA: Insufficient documentation

## 2016-01-02 DIAGNOSIS — F1290 Cannabis use, unspecified, uncomplicated: Secondary | ICD-10-CM | POA: Diagnosis not present

## 2016-01-02 DIAGNOSIS — R21 Rash and other nonspecific skin eruption: Secondary | ICD-10-CM | POA: Diagnosis present

## 2016-01-02 DIAGNOSIS — L270 Generalized skin eruption due to drugs and medicaments taken internally: Secondary | ICD-10-CM

## 2016-01-02 MED ORDER — HYDROXYZINE PAMOATE 25 MG PO CAPS: 25.0000 mg | ORAL_CAPSULE | Freq: Three times a day (TID) | ORAL | 0 refills | Status: DC | PRN

## 2016-01-02 MED ORDER — DEXAMETHASONE SODIUM PHOSPHATE 10 MG/ML IJ SOLN
4.0000 mg | Freq: Once | INTRAMUSCULAR | Status: AC
  Administered 2016-01-02: 4 mg via INTRAMUSCULAR
  Filled 2016-01-02: qty 1

## 2016-01-02 MED ORDER — RANITIDINE HCL 150 MG PO TABS
150.0000 mg | ORAL_TABLET | Freq: Two times a day (BID) | ORAL | 1 refills | Status: DC
Start: 2016-01-02 — End: 2017-11-07

## 2016-01-02 NOTE — ED Notes (Signed)
States she developed a slight rash yesterday  Woke up with rash to arms,legs,back and some facial swelling

## 2016-01-02 NOTE — ED Triage Notes (Signed)
Noticed rash to chest last night.  Benadryl taken.  Today rash to body.  Seen in ED 3 days ago for fever.  Fever resolved, but the same day rash started.

## 2016-01-02 NOTE — ED Provider Notes (Signed)
[facility] Emergency Department Provider Note  ____________________________________________  Time seen: Approximately 10:27 AM  I have reviewed the triage vital signs and the nursing notes.   HISTORY  Chief Complaint Rash    HPI Brenda Cohen is a 30 y.o. female presents for evaluation of progressively worsening rash which started last night on the chest. Patient states that she took Benadryl last night but the rash has spread throughout her trunk and back legs daily and palms of her hands. Denies any shortness of breath or chest pains. Recently started new medication Bactrim but quit taking yesterday.   Past Medical History:  Diagnosis Date  . Anxiety   . Depression     Patient Active Problem List   Diagnosis Date Noted  . Normal vaginal delivery 04/19/2015  . Labor and delivery indication for care or intervention 04/18/2015  . Labor and delivery, indication for care 04/10/2015  . Irregular contractions 03/29/2015  . Indication for care in labor or delivery 03/10/2015  . MDD (major depressive disorder), recurrent episode, mild (HCC) 01/08/2015  . Anxiety state 01/08/2015  . Major depressive disorder, recurrent episode, mild (HCC) 12/17/2014    Past Surgical History:  Procedure Laterality Date  . TUBAL LIGATION Bilateral 04/19/2015   Procedure: POST PARTUM TUBAL LIGATION;  Surgeon: [place] Bing, MD;  Location: [facility] ORS;  Service: Gynecology;  Laterality: Bilateral;  . WISDOM TOOTH EXTRACTION      Prior to Admission medications   Medication Sig Start Date End Date Taking? Authorizing Provider  hydrOXYzine (VISTARIL) 25 MG capsule Take 1 capsule (25 mg total) by mouth 3 (three) times daily as needed. 01/02/16   Charmayne Sheer Beers, PA-C  ranitidine (ZANTAC) 150 MG tablet Take 1 tablet (150 mg total) by mouth 2 (two) times daily. 01/02/16   Evangeline Dakin, PA-C    Allergies Review of patient's allergies indicates no known  allergies.  Family History  Problem Relation Age of Onset  . Drug abuse Mother   . Hyperlipidemia Father   . Hypertension Father   . Depression Father   . Migraines Sister   . Bipolar disorder Paternal Uncle   . Schizophrenia Paternal Uncle   . OCD Paternal Uncle     Social History Social History  Substance Use Topics  . Smoking status: Never Smoker  . Smokeless tobacco: Never Used  . Alcohol use 0.6 oz/week    1 Glasses of wine per week    Review of Systems Constitutional: No fever/chills ENT: No sore throat. Cardiovascular: Denies chest pain. Respiratory: Denies shortness of breath. Gastrointestinal: No abdominal pain.  No nausea, no vomiting.  No diarrhea.  No constipation. Musculoskeletal: Negative for back pain. Skin: Positive for rash Neurological: Negative for headaches, focal weakness or numbness.  10-point ROS otherwise negative.  ____________________________________________   PHYSICAL EXAM:  VITAL SIGNS: ED Triage Vitals  Enc Vitals Group     BP 01/02/16 1021 122/64     Pulse Rate 01/02/16 1021 100     Resp 01/02/16 1021 16     Temp 01/02/16 1021 98.8 F (37.1 C)     Temp Source 01/02/16 1021 Oral     SpO2 01/02/16 1021 100 %     Weight 01/02/16 1021 129 lb (58.5 kg)     Height 01/02/16 1021  (1.575 m)     Head Circumference --      Peak Flow --      Pain Score 01/02/16 1022 3     Pain Loc --  Pain Edu? --      Excl. in GC? --     Constitutional: Alert and oriented. Well appearing and in no acute distress. Nose: No congestion/rhinnorhea. Mouth/Throat: Mucous membranes are moist.  Oropharynx non-erythematous. Neck: No stridor. Supple full range of motion Cardiovascular: Normal rate, regular rhythm. Grossly normal heart sounds.  Good peripheral circulation. Respiratory: Normal respiratory effort.  No retractions. Lungs CTAB. Gastrointestinal: Soft and nontender. No distention. No abdominal bruits. No CVA tenderness. Musculoskeletal:  No lower extremity tenderness nor edema.  No joint effusions. Neurologic:  Normal speech and language. No gross focal neurologic deficits are appreciated. No gait instability. Skin:  Skin is warm, dry and intact. Positive for diffuse morbilliform rash or exanthem Psychiatric: Mood and affect are normal. Speech and behavior are normal.  ____________________________________________   LABS (all labs ordered are listed, but only abnormal results are displayed)  Labs Reviewed - No data to display ____________________________________________  EKG   ____________________________________________  RADIOLOGY   ____________________________________________   PROCEDURES  Procedure(s) performed: None  Critical Care performed: No  ____________________________________________   INITIAL IMPRESSION / ASSESSMENT AND PLAN / ED COURSE  Pertinent labs & imaging results that were available during my care of the patient were reviewed by me and considered in my medical decision making (see chart for details).  Drug rash. Patient was given Decadron 4mg  IM while in the ED. Discharge home with hydroxyzine and Zantac.  Clinical Course    ____________________________________________   FINAL CLINICAL IMPRESSION(S) / ED DIAGNOSES  Final diagnoses:  Drug rash     This chart was dictated using voice recognition software/Dragon. Despite best efforts to proofread, errors can occur which can change the meaning. Any change was purely unintentional.    Evangeline Dakin, PA-C 01/02/16 1043    Jene Every, MD 01/02/16 [phone]

## 2016-01-05 LAB — CULTURE, BLOOD (ROUTINE X 2)
Culture: NO GROWTH
Culture: NO GROWTH

## 2016-09-15 ENCOUNTER — Ambulatory Visit: Payer: Self-pay | Admitting: Obstetrics & Gynecology

## 2017-11-01 ENCOUNTER — Emergency Department: Payer: Medicaid Other

## 2017-11-01 ENCOUNTER — Emergency Department
Admission: EM | Admit: 2017-11-01 | Discharge: 2017-11-01 | Disposition: A | Discharge status: Home / Self Care | Payer: Medicaid Other | Attending: Student in an Organized Health Care Education/Training Program | Admitting: Student in an Organized Health Care Education/Training Program

## 2017-11-01 ENCOUNTER — Other Ambulatory Visit: Payer: Self-pay

## 2017-11-01 ENCOUNTER — Encounter: Payer: Self-pay | Admitting: Emergency Medicine

## 2017-11-01 DIAGNOSIS — O26899 Other specified pregnancy related conditions, unspecified trimester: Secondary | ICD-10-CM

## 2017-11-01 DIAGNOSIS — R102 Pelvic and perineal pain: Secondary | ICD-10-CM | POA: Insufficient documentation

## 2017-11-01 DIAGNOSIS — O283 Abnormal ultrasonic finding on antenatal screening of mother: Secondary | ICD-10-CM | POA: Diagnosis not present

## 2017-11-01 DIAGNOSIS — Z79899 Other long term (current) drug therapy: Secondary | ICD-10-CM | POA: Insufficient documentation

## 2017-11-01 DIAGNOSIS — O3680X0 Pregnancy with inconclusive fetal viability, not applicable or unspecified: Secondary | ICD-10-CM

## 2017-11-01 LAB — URINALYSIS, COMPLETE (UACMP) WITH MICROSCOPIC
BACTERIA UA: NONE SEEN
BILIRUBIN URINE: NEGATIVE
GLUCOSE, UA: NEGATIVE mg/dL
Hgb urine dipstick: NEGATIVE
KETONES UR: NEGATIVE mg/dL
LEUKOCYTES UA: NEGATIVE
NITRITE: NEGATIVE
PROTEIN: NEGATIVE mg/dL
Specific Gravity, Urine: 1.009 (ref 1.005–1.030)
pH: 5 (ref 5.0–8.0)

## 2017-11-01 LAB — CBC
HEMATOCRIT: 39.7 % (ref 35.0–47.0)
Hemoglobin: 13.5 g/dL (ref 12.0–16.0)
MCH: 31.2 pg (ref 26.0–34.0)
MCHC: 34.2 g/dL (ref 32.0–36.0)
MCV: 91.3 fL (ref 80.0–100.0)
Platelets: 270 10*3/uL (ref 150–440)
RBC: 4.34 MIL/uL (ref 3.80–5.20)
RDW: 14.1 % (ref 11.5–14.5)
WBC: 7.1 10*3/uL (ref 3.6–11.0)

## 2017-11-01 LAB — COMPREHENSIVE METABOLIC PANEL
ALT: 17 U/L (ref 14–54)
ANION GAP: 8 (ref 5–15)
AST: 22 U/L (ref 15–41)
Albumin: 4.3 g/dL (ref 3.5–5.0)
Alkaline Phosphatase: 40 U/L (ref 38–126)
BUN: 10 mg/dL (ref 6–20)
CHLORIDE: 104 mmol/L (ref 101–111)
CO2: 23 mmol/L (ref 22–32)
Calcium: 8.7 mg/dL — ABNORMAL LOW (ref 8.9–10.3)
Creatinine, Ser: 0.74 mg/dL (ref 0.44–1.00)
Glucose, Bld: 114 mg/dL — ABNORMAL HIGH (ref 65–99)
POTASSIUM: 3.2 mmol/L — AB (ref 3.5–5.1)
Sodium: 135 mmol/L (ref 135–145)
Total Bilirubin: 0.7 mg/dL (ref 0.3–1.2)
Total Protein: 7.3 g/dL (ref 6.5–8.1)

## 2017-11-01 LAB — HCG, QUANTITATIVE, PREGNANCY: hCG, Beta Chain, Quant, S: 147 m[IU]/mL — ABNORMAL HIGH (ref <5)

## 2017-11-01 LAB — POCT PREGNANCY, URINE: Preg Test, Ur: POSITIVE — AB

## 2017-11-01 MED ORDER — ACETAMINOPHEN 500 MG PO TABS
1000.0000 mg | ORAL_TABLET | Freq: Once | ORAL | Status: AC
  Administered 2017-11-01: 1000 mg via ORAL
  Filled 2017-11-01: qty 2

## 2017-11-01 NOTE — ED Provider Notes (Signed)
[facility] Emergency Department Provider Note    First MD Initiated Contact with Patient 11/01/17 1420     (approximate)  I have reviewed the triage vital signs and the nursing notes.   HISTORY  Chief Complaint Abdominal Pain    HPI Brenda Cohen is a 32 y.o. female the history of bilateral tubal ligation presents to the ER with a positive pregnancy test as well as cramping discomfort.  States that she did have some vaginal spotting last week.  Last mental cycle was at the end of April.  Denies any fevers.  No nausea or vomiting.  States she is having some mild cramping sensation now.  No radiation of pain.  Past Medical History:  Diagnosis Date  . Anxiety   . Depression    Family History  Problem Relation Age of Onset  . Drug abuse Mother   . Hyperlipidemia Father   . Hypertension Father   . Depression Father   . Migraines Sister   . Bipolar disorder Paternal Uncle   . Schizophrenia Paternal Uncle   . OCD Paternal Uncle    Past Surgical History:  Procedure Laterality Date  . TUBAL LIGATION Bilateral 04/19/2015   Procedure: POST PARTUM TUBAL LIGATION;  Surgeon: Edgewood Bing, MD;  Location: [facility] ORS;  Service: Gynecology;  Laterality: Bilateral;  . WISDOM TOOTH EXTRACTION     Patient Active Problem List   Diagnosis Date Noted  . Normal vaginal delivery 04/19/2015  . Labor and delivery indication for care or intervention 04/18/2015  . Labor and delivery, indication for care 04/10/2015  . Irregular contractions 03/29/2015  . Indication for care in labor or delivery 03/10/2015  . MDD (major depressive disorder), recurrent episode, mild (HCC) 01/08/2015  . Anxiety state 01/08/2015  . Major depressive disorder, recurrent episode, mild (HCC) 12/17/2014      Prior to Admission medications   Medication Sig Start Date End Date Taking? Authorizing Provider  hydrOXYzine (VISTARIL) 25 MG capsule Take 1 capsule (25 mg total) by mouth 3  (three) times daily as needed. 01/02/16   Beers, Charmayne Sheer, PA-C  ranitidine (ZANTAC) 150 MG tablet Take 1 tablet (150 mg total) by mouth 2 (two) times daily. 01/02/16   Beers, Charmayne Sheer, PA-C  sertraline (ZOLOFT) 100 MG tablet Take 100 mg by mouth daily.    [provider]    Allergies Bactrim [sulfamethoxazole-trimethoprim]    Social History Social History   Tobacco Use  . Smoking status: Never Smoker  . Smokeless tobacco: Never Used  Substance Use Topics  . Alcohol use: Yes    Alcohol/week: 0.6 oz    Types: 1 Glasses of wine per week  . Drug use: Yes    Types: Marijuana    Comment: not since she pregnant    Review of Systems Patient denies headaches, rhinorrhea, blurry vision, numbness, shortness of breath, chest pain, edema, cough, abdominal pain, nausea, vomiting, diarrhea, dysuria, fevers, rashes or hallucinations unless otherwise stated above in HPI. ____________________________________________   PHYSICAL EXAM:  VITAL SIGNS: Vitals:   11/01/17 1250  BP: (!) 123/94  Pulse: 100  Resp: 14  Temp: 98.5 F (36.9 C)  SpO2: 100%    Constitutional: Alert and oriented. Well appearing and in no acute distress. Eyes: Conjunctivae are normal.  Head: Atraumatic. Nose: No congestion/rhinnorhea. Mouth/Throat: Mucous membranes are moist.   Neck: No stridor. Painless ROM.  Cardiovascular: Normal rate, regular rhythm. Grossly normal heart sounds.  Good peripheral circulation. Respiratory: Normal respiratory effort.  No retractions. Lungs CTAB. Gastrointestinal: Soft and nontender. No distention. No abdominal bruits. No CVA tenderness. Genitourinary: deferred Musculoskeletal: No lower extremity tenderness nor edema.  No joint effusions. Neurologic:  Normal speech and language. No gross focal neurologic deficits are appreciated. No facial droop Skin:  Skin is warm, dry and intact. No rash noted. Psychiatric: Mood and affect are normal. Speech and behavior are  normal.  ____________________________________________   LABS (all labs ordered are listed, but only abnormal results are displayed)  Results for orders placed or performed during the hospital encounter of 11/01/17 (from the past 24 hour(s))  Comprehensive metabolic panel     Status: Abnormal   Collection Time: 11/01/17 12:59 PM  Result Value Ref Range   Sodium 135 135 - 145 mmol/L   Potassium 3.2 (L) 3.5 - 5.1 mmol/L   Chloride 104 101 - 111 mmol/L   CO2 23 22 - 32 mmol/L   Glucose, Bld 114 (H) 65 - 99 mg/dL   BUN 10 6 - 20 mg/dL   Creatinine, Ser 1.61 0.44 - 1.00 mg/dL   Calcium 8.7 (L) 8.9 - 10.3 mg/dL   Total Protein 7.3 6.5 - 8.1 g/dL   Albumin 4.3 3.5 - 5.0 g/dL   AST 22 15 - 41 U/L   ALT 17 14 - 54 U/L   Alkaline Phosphatase 40 38 - 126 U/L   Total Bilirubin 0.7 0.3 - 1.2 mg/dL   GFR calc non Af Amer >60 >60 mL/min   GFR calc Af Amer >60 >60 mL/min   Anion gap 8 5 - 15  CBC     Status: None   Collection Time: 11/01/17 12:59 PM  Result Value Ref Range   WBC 7.1 3.6 - 11.0 K/uL   RBC 4.34 3.80 - 5.20 MIL/uL   Hemoglobin 13.5 12.0 - 16.0 g/dL   HCT 09.6 04.5 - 40.9 %   MCV 91.3 80.0 - 100.0 fL   MCH 31.2 26.0 - 34.0 pg   MCHC 34.2 32.0 - 36.0 g/dL   RDW 81.1 91.4 - 78.2 %   Platelets 270 150 - 440 K/uL  Urinalysis, Complete w Microscopic     Status: Abnormal   Collection Time: 11/01/17 12:59 PM  Result Value Ref Range   Color, Urine YELLOW (A) YELLOW   APPearance CLEAR (A) CLEAR   Specific Gravity, Urine 1.009 1.005 - 1.030   pH 5.0 5.0 - 8.0   Glucose, UA NEGATIVE NEGATIVE mg/dL   Hgb urine dipstick NEGATIVE NEGATIVE   Bilirubin Urine NEGATIVE NEGATIVE   Ketones, ur NEGATIVE NEGATIVE mg/dL   Protein, ur NEGATIVE NEGATIVE mg/dL   Nitrite NEGATIVE NEGATIVE   Leukocytes, UA NEGATIVE NEGATIVE   RBC / HPF 0-5 0 - 5 RBC/hpf   WBC, UA 0-5 0 - 5 WBC/hpf   Bacteria, UA NONE SEEN NONE SEEN   Squamous Epithelial / LPF 0-5 0 - 5  Pregnancy, urine POC     Status:  Abnormal   Collection Time: 11/01/17  1:09 PM  Result Value Ref Range   Preg Test, Ur POSITIVE (A) NEGATIVE   ____________________________________________ ____________________________________________  RADIOLOGY  I personally reviewed all radiographic images ordered to evaluate for the above acute complaints and reviewed radiology reports and findings.  These findings were personally discussed with the patient.  Please see medical record for radiology report.  ____________________________________________   PROCEDURES  Procedure(s) performed:  Procedures    Critical Care performed: no ____________________________________________   INITIAL IMPRESSION / ASSESSMENT AND PLAN /  ED COURSE  Pertinent labs & imaging results that were available during my care of the patient were reviewed by me and considered in my medical decision making (see chart for details).  DDX: ectopic, iup, threated ab  Brenda Cohen is a 32 y.o. who presents to the ED with presentation as described above.  Patient has tubal ligation but is pregnant which is concerning for ectopic and certainly failed tubal ligation.  Patient seemed dynamically stable.  Her abdominal exam is completely benign.  Patient without any evidence of active bleeding at this time.  Blood work sent for the above differential.  Will order ultrasound to evaluate for IUP.  Ultrasound does not show evidence of IUP but also does not show any evidence of pelvic free fluid or tubo-ovarian fluid collection or edema.  Clinical Course as of Nov 02 1731  Thu Nov 01, 2017  1729 Patient is rh positive.     [PR]  1732 Patient reassessed.  Remains hemodynamically stable.  Repeat abdominal exam is soft and benign.  I spoke with Dr. Bonney Aid of OB/GYN regarding the patient's presentation he agrees the patient will require repeat hCG level on Saturday.  Discussed patient's need to follow-up in ER for repeat lab test on Saturday at which point if the  quant is equivocal or rising will require consultation with OB/GYN.  Regardless patient will require outpatient follow-up regarding failed tubal ligation.  Discussed strict return precautions.  Patient demonstrates understanding and agrees with plan.   [PR]    Clinical Course User Index [PR] Willy Eddy, MD     As part of my medical decision making, I reviewed the following data within the electronic MEDICAL RECORD NUMBER Nursing notes reviewed and incorporated, Labs reviewed, notes from prior ED visits.   ____________________________________________   FINAL CLINICAL IMPRESSION(S) / ED DIAGNOSES  Final diagnoses:  Pelvic pain affecting pregnancy      NEW MEDICATIONS STARTED DURING THIS VISIT:  New Prescriptions   No medications on file     Note:  This document was prepared using Dragon voice recognition software and may include unintentional dictation errors.    Willy Eddy, MD 11/01/17 [phone]

## 2017-11-01 NOTE — ED Notes (Signed)
Pt presents with abdominal cramping today. She states it is annoying and she wants it to go away. She reports that she always has cramping when she menstruates, but that she has had positive pregnancy test. Pt states she has had tubal ligation.

## 2017-11-01 NOTE — ED Notes (Signed)
Pt discharged home after verbalizing understanding of discharge instructions; nad noted. 

## 2017-11-01 NOTE — ED Notes (Signed)
See triage note  Presents with some abd cramping/discomfort   States her last period was 4 weeks ago  Had several positive preg test at home

## 2017-11-01 NOTE — Discharge Instructions (Addendum)
Please return to the ER on Saturday for repeat hCG level.  Need for evaluation by OB/GYN that day will be based on your hCG level.  Return for worsening symptoms or for any additional questions or concerns.

## 2017-11-01 NOTE — ED Triage Notes (Signed)
About 1.5 week ago had abd cramping and had small amount of vag bleeding.  Says 2 days ago had small amt blood.  Says she h as 7 positive pregnancies at home.  Says she is concerned because she had tubal ligation a few years ago.

## 2017-11-06 ENCOUNTER — Ambulatory Visit (INDEPENDENT_AMBULATORY_CARE_PROVIDER_SITE_OTHER): Payer: Medicaid Other | Admitting: Obstetrics & Gynecology

## 2017-11-06 ENCOUNTER — Encounter: Payer: Self-pay | Admitting: Obstetrics & Gynecology

## 2017-11-06 VITALS — BP 120/80 | Ht 60.2 in | Wt 143.0 lb

## 2017-11-06 DIAGNOSIS — Z3202 Encounter for pregnancy test, result negative: Secondary | ICD-10-CM

## 2017-11-06 DIAGNOSIS — O200 Threatened abortion: Secondary | ICD-10-CM

## 2017-11-06 LAB — POCT URINE PREGNANCY: Preg Test, Ur: POSITIVE — AB

## 2017-11-06 NOTE — Progress Notes (Signed)
Obstetric Problem Visit   Chief Complaint:  Chief Complaint  Patient presents with  . Consult   History of Present Illness: Patient is a 32 y.o. [id number] Unknown presenting for first trimester bleeding.  The onset of bleeding was today.  Is bleeding equal to or greater than normal menstrual flow:  No Any recent trauma:  No Recent intercourse:  Yes History of prior miscarriage:  No Prior ultrasound demonstrating IUP:  No Prior ultrasound demonstrating viable IUP:  No Prior Serum HCG:  Yes 147 on 11/01/17 Rh status: A POS History of PP BTL 2 years ago  PMHx: She  has a past medical history of Anxiety and Depression. Also,  has a past surgical history that includes Wisdom tooth extraction and Tubal ligation (Bilateral, 04/19/2015)., family history includes Bipolar disorder in her paternal uncle; Depression in her father; Drug abuse in her mother; Hyperlipidemia in her father; Hypertension in her father; Migraines in her sister; OCD in her paternal uncle; Schizophrenia in her paternal uncle.,  reports that she has never smoked. She has never used smokeless tobacco. She reports that she drinks about 0.6 oz of alcohol per week. She reports that she has current or past drug history. Drug: Marijuana.  She has a current medication list which includes the following prescription(s): hydroxyzine, ranitidine, and sertraline. Also, is allergic to bactrim [sulfamethoxazole-trimethoprim].  Review of Systems  Constitutional: Positive for malaise/fatigue. Negative for chills and fever.  HENT: Negative for congestion, sinus pain and sore throat.   Eyes: Negative for blurred vision and pain.  Respiratory: Negative for cough and wheezing.   Cardiovascular: Negative for chest pain and leg swelling.  Gastrointestinal: Negative for abdominal pain, constipation, diarrhea, heartburn, nausea and vomiting.  Genitourinary: Negative for dysuria, frequency, hematuria and urgency.  Musculoskeletal: Negative for back  pain, joint pain, myalgias and neck pain.  Skin: Negative for itching and rash.  Neurological: Negative for dizziness, tremors and weakness.  Endo/Heme/Allergies: Does not bruise/bleed easily.  Psychiatric/Behavioral: Negative for depression. The patient is not nervous/anxious and does not have insomnia.    Objective: Vitals:   11/06/17 1428  BP: 120/80   Physical Exam  Constitutional: She is oriented to person, place, and time. She appears well-developed and well-nourished. No distress.  Musculoskeletal: Normal range of motion.  Neurological: She is alert and oriented to person, place, and time.  Skin: Skin is warm and dry.  Psychiatric: She has a normal mood and affect.  Vitals reviewed.   Assessment: 32 y.o. G3P3003 Unknown 1. Abortion, threatened, early pregnancy - Beta hCG quant (ref lab) - POCT urine pregnancy  1) First trimester bleeding - incidence and clinical course of first trimester bleeding is discussed in detail with the patient today.  Approximately 1/3 of pregnancies ending in live births experienced 1st trimester bleeding.  The amount of bleeding is variable and not necessarily predictive of outcome.  Sources may be cervical or uterine.  Subchorionic hemorrhages are a frequent concurrent findings on ultrasound and are followed expectantly.  These often absorb or regress spontaneously although risk for expansion and further disruption of the utero-placental interface leading to miscarriage is possible.  There is no clearly documented benefit to limiting or modifying activity and sexual intercourse in altering clinic course of 1st trimester bleeding.    2) If not already done will proceed with TVUS evaluation to document viability, and if uncertain viability or absence of a demonstrable IUP (and no previous documentation of IUP) will trend HCG levels.  3) The patient is Rh  POS, rhogam is therefore not indicated to decrease the risk rhesus alloimmunization.    4) Routine  bleeding precautions were discussed with the patient prior the conclusion of today's visit.  5) Birth control options discussed, as has had prior tubal with current failure.  Options discussed, may want laparoscopy salpingectomy.  Annamarie Major, MD, [facility], [facility] 11/06/2017  2:50 PM

## 2017-11-07 ENCOUNTER — Telehealth: Payer: Self-pay | Admitting: Obstetrics & Gynecology

## 2017-11-07 ENCOUNTER — Other Ambulatory Visit: Payer: Self-pay | Admitting: Obstetrics & Gynecology

## 2017-11-07 LAB — BETA HCG QUANT (REF LAB): hCG Quant: 203 m[IU]/mL

## 2017-11-07 NOTE — Telephone Encounter (Signed)
Patient is calling about her Labs results. Patient has questions about her my chart from her ER visit 11/01/17 about not being told about a ovarian cyst being found . Please advise

## 2017-11-07 NOTE — Progress Notes (Signed)
   PRE-OPERATIVE HISTORY AND PHYSICAL EXAM  HPI:  Brenda Cohen is a 32 y.o. G3P3003 No LMP recorded.; she is being admitted for surgery related to pain and bleeding related to probable ectopic pregnancy.  Pt has had prior PP BTL, and recently noted Positive uCG along with pain and spotting.  Beta hCG levels have been 147 then 203.  Concerns for ectopic discussed, pt to have tubes evaluated and removed.  PMHx: Past Medical History:  Diagnosis Date  . Anxiety   . Depression    Past Surgical History:  Procedure Laterality Date  . TUBAL LIGATION Bilateral 04/19/2015   Procedure: POST PARTUM TUBAL LIGATION;  Surgeon: Charlie Pickens, MD;  Location: [facility] ORS;  Service: Gynecology;  Laterality: Bilateral;  . WISDOM TOOTH EXTRACTION     Family History  Problem Relation Age of Onset  . Drug abuse Mother   . Hyperlipidemia Father   . Hypertension Father   . Depression Father   . Migraines Sister   . Bipolar disorder Paternal Uncle   . Schizophrenia Paternal Uncle   . OCD Paternal Uncle    Social History   Tobacco Use  . Smoking status: Never Smoker  . Smokeless tobacco: Never Used  Substance Use Topics  . Alcohol use: Yes    Alcohol/week: 0.6 oz    Types: 1 Glasses of wine per week  . Drug use: Yes    Types: Marijuana    Comment: not since she pregnant    Current Outpatient Medications:  .  acetaminophen (TYLENOL) 500 MG tablet, Take 1,000 mg by mouth every 6 (six) hours as needed for moderate pain., Disp: , Rfl:  Allergies: Bactrim [sulfamethoxazole-trimethoprim]  Review of Systems  Constitutional: Negative for chills, fever and malaise/fatigue.  HENT: Negative for congestion, sinus pain and sore throat.   Eyes: Negative for blurred vision and pain.  Respiratory: Negative for cough and wheezing.   Cardiovascular: Negative for chest pain and leg swelling.  Gastrointestinal: Negative for abdominal pain, constipation, diarrhea, heartburn, nausea and vomiting.    Genitourinary: Negative for dysuria, frequency, hematuria and urgency.  Musculoskeletal: Negative for back pain, joint pain, myalgias and neck pain.  Skin: Negative for itching and rash.  Neurological: Negative for dizziness, tremors and weakness.  Endo/Heme/Allergies: Does not bruise/bleed easily.  Psychiatric/Behavioral: Negative for depression. The patient is not nervous/anxious and does not have insomnia.     Objective: There were no vitals taken for this visit. There were no vitals filed for this visit. Physical Exam  Constitutional: She is oriented to person, place, and time. She appears well-developed and well-nourished. No distress.  HENT:  Head: Normocephalic and atraumatic. Head is without laceration.  Right Ear: Hearing normal.  Left Ear: Hearing normal.  Nose: No epistaxis.  No foreign bodies.  Mouth/Throat: Uvula is midline, oropharynx is clear and moist and mucous membranes are normal.  Eyes: Pupils are equal, round, and reactive to light.  Neck: Normal range of motion. Neck supple. No thyromegaly present.  Cardiovascular: Normal rate and regular rhythm. Exam reveals no gallop and no friction rub.  No murmur heard. Pulmonary/Chest: Effort normal and breath sounds normal. No respiratory distress. She has no wheezes. Right breast exhibits no mass, no skin change and no tenderness. Left breast exhibits no mass, no skin change and no tenderness.  Abdominal: Soft. Bowel sounds are normal. She exhibits no distension. There is no tenderness. There is no rebound.  Musculoskeletal: Normal range of motion.  Neurological: She is alert and oriented   to person, place, and time. No cranial nerve deficit.  Skin: Skin is warm and dry.  Psychiatric: She has a normal mood and affect. Judgment normal.  Vitals reviewed.  Assessment: Ectopic Pregnancy, Prior Tubal Ligation, Pelvic pain, Abnormal uterine bleeding  I have had a careful discussion with this patient about all the options  available and the risk/benefits of each. I have fully informed this patient that surgery may subject her to a variety of discomforts and risks: She understands that most patients have surgery with little difficulty, but problems can happen ranging from minor to fatal. These include nausea, vomiting, pain, bleeding, infection, poor healing, hernia, or formation of adhesions. Unexpected reactions may occur from any drug or anesthetic given. Unintended injury may occur to other pelvic or abdominal structures such as Fallopian tubes, ovaries, bladder, ureter (tube from kidney to bladder), or bowel. Nerves going from the pelvis to the legs may be injured. Any such injury may require immediate or later additional surgery to correct the problem. Excessive blood loss requiring transfusion is very unlikely but possible. Dangerous blood clots may form in the legs or lungs. Physical and sexual activity will be restricted in varying degrees for an indeterminate period of time but most often 2-6 weeks.  Finally, she understands that it is impossible to list every possible undesirable effect and that the condition for which surgery is done is not always cured or significantly improved, and in rare cases may be even worse.Ample time was given to answer all questions.  Paul Harris, MD, FACOG [facility], [facility] 11/07/2017  1:16 PM  

## 2017-11-07 NOTE — H&P (View-Only) (Signed)
PRE-OPERATIVE HISTORY AND PHYSICAL EXAM  HPI:  Brenda Cohen is a 32 y.o. [phone] No LMP recorded.; she is being admitted for surgery related to pain and bleeding related to probable ectopic pregnancy.  Pt has had prior PP BTL, and recently noted Positive uCG along with pain and spotting.  Beta hCG levels have been 147 then 203.  Concerns for ectopic discussed, pt to have tubes evaluated and removed.  PMHx: Past Medical History:  Diagnosis Date  . Anxiety   . Depression    Past Surgical History:  Procedure Laterality Date  . TUBAL LIGATION Bilateral 04/19/2015   Procedure: POST PARTUM TUBAL LIGATION;  Surgeon: [place] Bingharlie Pickens, MD;  Location: [facility] ORS;  Service: Gynecology;  Laterality: Bilateral;  . WISDOM TOOTH EXTRACTION     Family History  Problem Relation Age of Onset  . Drug abuse Mother   . Hyperlipidemia Father   . Hypertension Father   . Depression Father   . Migraines Sister   . Bipolar disorder Paternal Uncle   . Schizophrenia Paternal Uncle   . OCD Paternal Uncle    Social History   Tobacco Use  . Smoking status: Never Smoker  . Smokeless tobacco: Never Used  Substance Use Topics  . Alcohol use: Yes    Alcohol/week: 0.6 oz    Types: 1 Glasses of wine per week  . Drug use: Yes    Types: Marijuana    Comment: not since she pregnant    Current Outpatient Medications:  .  acetaminophen (TYLENOL) 500 MG tablet, Take 1,000 mg by mouth every 6 (six) hours as needed for moderate pain., Disp: , Rfl:  Allergies: Bactrim [sulfamethoxazole-trimethoprim]  Review of Systems  Constitutional: Negative for chills, fever and malaise/fatigue.  HENT: Negative for congestion, sinus pain and sore throat.   Eyes: Negative for blurred vision and pain.  Respiratory: Negative for cough and wheezing.   Cardiovascular: Negative for chest pain and leg swelling.  Gastrointestinal: Negative for abdominal pain, constipation, diarrhea, heartburn, nausea and vomiting.    Genitourinary: Negative for dysuria, frequency, hematuria and urgency.  Musculoskeletal: Negative for back pain, joint pain, myalgias and neck pain.  Skin: Negative for itching and rash.  Neurological: Negative for dizziness, tremors and weakness.  Endo/Heme/Allergies: Does not bruise/bleed easily.  Psychiatric/Behavioral: Negative for depression. The patient is not nervous/anxious and does not have insomnia.     Objective: There were no vitals taken for this visit. There were no vitals filed for this visit. Physical Exam  Constitutional: She is oriented to person, place, and time. She appears well-developed and well-nourished. No distress.  HENT:  Head: Normocephalic and atraumatic. Head is without laceration.  Right Ear: Hearing normal.  Left Ear: Hearing normal.  Nose: No epistaxis.  No foreign bodies.  Mouth/Throat: Uvula is midline, oropharynx is clear and moist and mucous membranes are normal.  Eyes: Pupils are equal, round, and reactive to light.  Neck: Normal range of motion. Neck supple. No thyromegaly present.  Cardiovascular: Normal rate and regular rhythm. Exam reveals no gallop and no friction rub.  No murmur heard. Pulmonary/Chest: Effort normal and breath sounds normal. No respiratory distress. She has no wheezes. Right breast exhibits no mass, no skin change and no tenderness. Left breast exhibits no mass, no skin change and no tenderness.  Abdominal: Soft. Bowel sounds are normal. She exhibits no distension. There is no tenderness. There is no rebound.  Musculoskeletal: Normal range of motion.  Neurological: She is alert and oriented  to person, place, and time. No cranial nerve deficit.  Skin: Skin is warm and dry.  Psychiatric: She has a normal mood and affect. Judgment normal.  Vitals reviewed.  Assessment: Ectopic Pregnancy, Prior Tubal Ligation, Pelvic pain, Abnormal uterine bleeding  I have had a careful discussion with this patient about all the options  available and the risk/benefits of each. I have fully informed this patient that surgery may subject her to a variety of discomforts and risks: She understands that most patients have surgery with little difficulty, but problems can happen ranging from minor to fatal. These include nausea, vomiting, pain, bleeding, infection, poor healing, hernia, or formation of adhesions. Unexpected reactions may occur from any drug or anesthetic given. Unintended injury may occur to other pelvic or abdominal structures such as Fallopian tubes, ovaries, bladder, ureter (tube from kidney to bladder), or bowel. Nerves going from the pelvis to the legs may be injured. Any such injury may require immediate or later additional surgery to correct the problem. Excessive blood loss requiring transfusion is very unlikely but possible. Dangerous blood clots may form in the legs or lungs. Physical and sexual activity will be restricted in varying degrees for an indeterminate period of time but most often 2-6 weeks.  Finally, she understands that it is impossible to list every possible undesirable effect and that the condition for which surgery is done is not always cured or significantly improved, and in rare cases may be even worse.Ample time was given to answer all questions.  Annamarie Major, MD, [facility], [facility] 11/07/2017  1:16 PM

## 2017-11-07 NOTE — Progress Notes (Signed)
Surgery Booking Request Patient Full Name:  Brenda Cohen  MRN: [id number]  DOB: [birth year]  Surgeon: Letitia Libraobert Paul Harris, MD  Requested Surgery Date and Time: 11/08/17 Primary Diagnosis AND Code: Ectopic pregnancy Secondary Diagnosis and Code:  Surgical Procedure: Laparoscopy, Salpingectomy bilateral, D&C L&D Notification: No Admission Status: same day surgery Length of Surgery: 1 hour Special Case Needs: no H&P: yes today done (date) Phone Interview???: yes Interpreter: Language:  Medical Clearance: no Special Scheduling Instructions: no

## 2017-11-07 NOTE — Progress Notes (Signed)
Add her on schedule for this morning, she is on way (work in for surgery tomorrow)

## 2017-11-08 ENCOUNTER — Ambulatory Visit: Payer: Medicaid Other | Admitting: Anesthesiology

## 2017-11-08 ENCOUNTER — Encounter
Admission: RE | Disposition: A | Discharge status: Home / Self Care | Payer: Self-pay | Source: Ambulatory Visit | Attending: Obstetrics & Gynecology

## 2017-11-08 ENCOUNTER — Ambulatory Visit
Admission: RE | Admit: 2017-11-08 | Discharge: 2017-11-08 | Disposition: A | Discharge status: Home / Self Care | Payer: Medicaid Other | Source: Ambulatory Visit | Attending: Obstetrics & Gynecology | Admitting: Obstetrics & Gynecology

## 2017-11-08 DIAGNOSIS — O00102 Left tubal pregnancy without intrauterine pregnancy: Secondary | ICD-10-CM | POA: Diagnosis not present

## 2017-11-08 DIAGNOSIS — Z881 Allergy status to other antibiotic agents status: Secondary | ICD-10-CM | POA: Insufficient documentation

## 2017-11-08 DIAGNOSIS — Z882 Allergy status to sulfonamides status: Secondary | ICD-10-CM | POA: Diagnosis not present

## 2017-11-08 DIAGNOSIS — Z9851 Tubal ligation status: Secondary | ICD-10-CM | POA: Insufficient documentation

## 2017-11-08 DIAGNOSIS — K661 Hemoperitoneum: Secondary | ICD-10-CM | POA: Diagnosis present

## 2017-11-08 HISTORY — PX: DILATION AND EVACUATION: SHX1459

## 2017-11-08 HISTORY — PX: LAPAROSCOPIC BILATERAL SALPINGECTOMY: SHX5889

## 2017-11-08 LAB — TYPE AND SCREEN
ABO/RH(D): A POS
ANTIBODY SCREEN: NEGATIVE

## 2017-11-08 LAB — CBC
HCT: 40.5 % (ref 35.0–47.0)
Hemoglobin: 13.7 g/dL (ref 12.0–16.0)
MCH: 31.3 pg (ref 26.0–34.0)
MCHC: 33.9 g/dL (ref 32.0–36.0)
MCV: 92.4 fL (ref 80.0–100.0)
PLATELETS: 294 10*3/uL (ref 150–440)
RBC: 4.38 MIL/uL (ref 3.80–5.20)
RDW: 14.3 % (ref 11.5–14.5)
WBC: 7.9 10*3/uL (ref 3.6–11.0)

## 2017-11-08 SURGERY — SALPINGECTOMY, BILATERAL, LAPAROSCOPIC
Anesthesia: General

## 2017-11-08 MED ORDER — MIDAZOLAM HCL 2 MG/2ML IJ SOLN
INTRAMUSCULAR | Status: AC
  Filled 2017-11-08: qty 4

## 2017-11-08 MED ORDER — FENTANYL CITRATE (PF) 100 MCG/2ML IJ SOLN
INTRAMUSCULAR | Status: AC
  Filled 2017-11-08: qty 2

## 2017-11-08 MED ORDER — GLYCOPYRROLATE 0.2 MG/ML IJ SOLN
INTRAMUSCULAR | Status: AC
  Filled 2017-11-08: qty 1

## 2017-11-08 MED ORDER — OXYCODONE-ACETAMINOPHEN 5-325 MG PO TABS
ORAL_TABLET | ORAL | Status: AC
  Filled 2017-11-08: qty 1

## 2017-11-08 MED ORDER — OXYCODONE-ACETAMINOPHEN 5-325 MG PO TABS: 1.0000 | ORAL_TABLET | ORAL | 0 refills | Status: DC | PRN

## 2017-11-08 MED ORDER — LIDOCAINE HCL (PF) 2 % IJ SOLN
INTRAMUSCULAR | Status: AC
Start: 2017-11-08 — End: ?
  Filled 2017-11-08: qty 10

## 2017-11-08 MED ORDER — MIDAZOLAM HCL 2 MG/2ML IJ SOLN
INTRAMUSCULAR | Status: DC | PRN
  Administered 2017-11-08: 2 mg via INTRAVENOUS

## 2017-11-08 MED ORDER — FENTANYL CITRATE (PF) 100 MCG/2ML IJ SOLN
25.0000 ug | INTRAMUSCULAR | Status: DC | PRN
  Administered 2017-11-08 (×3): 25 ug via INTRAVENOUS

## 2017-11-08 MED ORDER — DEXAMETHASONE SODIUM PHOSPHATE 10 MG/ML IJ SOLN
INTRAMUSCULAR | Status: DC | PRN
  Administered 2017-11-08: 8 mg via INTRAVENOUS

## 2017-11-08 MED ORDER — ACETAMINOPHEN 325 MG PO TABS: 650.0000 mg | ORAL_TABLET | ORAL | Status: DC | PRN

## 2017-11-08 MED ORDER — SUGAMMADEX SODIUM 200 MG/2ML IV SOLN
INTRAVENOUS | Status: AC
  Filled 2017-11-08: qty 2

## 2017-11-08 MED ORDER — GLYCOPYRROLATE 0.2 MG/ML IJ SOLN
INTRAMUSCULAR | Status: DC | PRN
  Administered 2017-11-08: 0.2 mg via INTRAVENOUS

## 2017-11-08 MED ORDER — ONDANSETRON HCL 4 MG/2ML IJ SOLN
INTRAMUSCULAR | Status: AC
  Filled 2017-11-08: qty 2

## 2017-11-08 MED ORDER — BUPIVACAINE HCL (PF) 0.5 % IJ SOLN
INTRAMUSCULAR | Status: DC | PRN
  Administered 2017-11-08: 18 mL

## 2017-11-08 MED ORDER — MORPHINE SULFATE (PF) 4 MG/ML IV SOLN: 1.0000 mg | INTRAVENOUS | Status: DC | PRN

## 2017-11-08 MED ORDER — ROCURONIUM BROMIDE 50 MG/5ML IV SOLN
INTRAVENOUS | Status: AC
  Filled 2017-11-08: qty 1

## 2017-11-08 MED ORDER — LACTATED RINGERS IV SOLN
INTRAVENOUS | Status: DC
  Administered 2017-11-08 (×2): via INTRAVENOUS

## 2017-11-08 MED ORDER — ACETAMINOPHEN 650 MG RE SUPP
650.0000 mg | RECTAL | Status: DC | PRN
  Filled 2017-11-08: qty 1

## 2017-11-08 MED ORDER — FENTANYL CITRATE (PF) 100 MCG/2ML IJ SOLN
INTRAMUSCULAR | Status: AC
  Filled 2017-11-08: qty 4

## 2017-11-08 MED ORDER — DEXAMETHASONE SODIUM PHOSPHATE 10 MG/ML IJ SOLN
INTRAMUSCULAR | Status: AC
  Filled 2017-11-08: qty 1

## 2017-11-08 MED ORDER — OXYCODONE-ACETAMINOPHEN 5-325 MG PO TABS
1.0000 | ORAL_TABLET | ORAL | Status: DC | PRN
  Administered 2017-11-08: 1 via ORAL

## 2017-11-08 MED ORDER — LACTATED RINGERS IV SOLN: INTRAVENOUS | Status: DC

## 2017-11-08 MED ORDER — KETOROLAC TROMETHAMINE 30 MG/ML IJ SOLN
30.0000 mg | Freq: Four times a day (QID) | INTRAMUSCULAR | Status: DC
  Filled 2017-11-08: qty 1

## 2017-11-08 MED ORDER — SUCCINYLCHOLINE CHLORIDE 20 MG/ML IJ SOLN
INTRAMUSCULAR | Status: DC | PRN
  Administered 2017-11-08: 100 mg via INTRAVENOUS

## 2017-11-08 MED ORDER — OXYCODONE HCL 5 MG PO TABS: 5.0000 mg | ORAL_TABLET | Freq: Once | ORAL | Status: DC | PRN

## 2017-11-08 MED ORDER — OXYCODONE HCL 5 MG/5ML PO SOLN: 5.0000 mg | Freq: Once | ORAL | Status: DC | PRN

## 2017-11-08 MED ORDER — SUGAMMADEX SODIUM 200 MG/2ML IV SOLN
INTRAVENOUS | Status: DC | PRN
  Administered 2017-11-08: 120 mg via INTRAVENOUS

## 2017-11-08 MED ORDER — PROPOFOL 10 MG/ML IV BOLUS
INTRAVENOUS | Status: DC | PRN
  Administered 2017-11-08: 150 mg via INTRAVENOUS

## 2017-11-08 MED ORDER — KETOROLAC TROMETHAMINE 30 MG/ML IJ SOLN
INTRAMUSCULAR | Status: DC | PRN
  Administered 2017-11-08: 30 mg via INTRAVENOUS

## 2017-11-08 MED ORDER — FENTANYL CITRATE (PF) 100 MCG/2ML IJ SOLN
INTRAMUSCULAR | Status: DC | PRN
  Administered 2017-11-08: 50 ug via INTRAVENOUS
  Administered 2017-11-08 (×2): 25 ug via INTRAVENOUS

## 2017-11-08 MED ORDER — PROPOFOL 10 MG/ML IV BOLUS
INTRAVENOUS | Status: AC
  Filled 2017-11-08: qty 20

## 2017-11-08 MED ORDER — ROCURONIUM BROMIDE 100 MG/10ML IV SOLN
INTRAVENOUS | Status: DC | PRN
  Administered 2017-11-08: 15 mg via INTRAVENOUS
  Administered 2017-11-08: 5 mg via INTRAVENOUS

## 2017-11-08 MED ORDER — BUPIVACAINE HCL (PF) 0.5 % IJ SOLN
INTRAMUSCULAR | Status: AC
  Filled 2017-11-08: qty 30

## 2017-11-08 MED ORDER — SEVOFLURANE IN SOLN
RESPIRATORY_TRACT | Status: AC
  Filled 2017-11-08: qty 250

## 2017-11-08 MED ORDER — ONDANSETRON HCL 4 MG/2ML IJ SOLN
INTRAMUSCULAR | Status: DC | PRN
  Administered 2017-11-08: 4 mg via INTRAVENOUS

## 2017-11-08 MED ORDER — LIDOCAINE HCL (CARDIAC) PF 100 MG/5ML IV SOSY
PREFILLED_SYRINGE | INTRAVENOUS | Status: DC | PRN
  Administered 2017-11-08: 60 mg via INTRAVENOUS

## 2017-11-08 SURGICAL SUPPLY — 52 items
BAG COUNTER SPONGE EZ (MISCELLANEOUS) ×2 IMPLANT
BLADE SURG SZ11 CARB STEEL (BLADE) ×3 IMPLANT
CANISTER SUC SOCK COL 7IN (MISCELLANEOUS) IMPLANT
CANISTER SUCT 1200ML W/VALVE (MISCELLANEOUS) ×3 IMPLANT
CATH ROBINSON RED A/P 16FR (CATHETERS) ×3 IMPLANT
CHLORAPREP W/TINT 26ML (MISCELLANEOUS) ×3 IMPLANT
COUNTER SPONGE BAG EZ (MISCELLANEOUS) ×1
DERMABOND ADVANCED (GAUZE/BANDAGES/DRESSINGS) ×2
DERMABOND ADVANCED .7 DNX12 (GAUZE/BANDAGES/DRESSINGS) ×1 IMPLANT
DRSG TELFA 4X3 1S NADH ST (GAUZE/BANDAGES/DRESSINGS) IMPLANT
FILTER UTR ASPR SPEC (MISCELLANEOUS) ×1 IMPLANT
FLTR UTR ASPR SPEC (MISCELLANEOUS) ×3
GLOVE BIO SURGEON STRL SZ8 (GLOVE) ×3 IMPLANT
GLOVE INDICATOR 8.0 STRL GRN (GLOVE) ×3 IMPLANT
GOWN STRL REUS W/ TWL LRG LVL3 (GOWN DISPOSABLE) ×1 IMPLANT
GOWN STRL REUS W/ TWL XL LVL3 (GOWN DISPOSABLE) ×1 IMPLANT
GOWN STRL REUS W/TWL LRG LVL3 (GOWN DISPOSABLE) ×2
GOWN STRL REUS W/TWL XL LVL3 (GOWN DISPOSABLE) ×2
GRASPER SUT TROCAR 14GX15 (MISCELLANEOUS) ×3 IMPLANT
IRRIGATION STRYKERFLOW (MISCELLANEOUS) ×2 IMPLANT
IRRIGATOR STRYKERFLOW (MISCELLANEOUS) ×6
IV LACTATED RINGERS 1000ML (IV SOLUTION) ×3 IMPLANT
KIT BERKELEY 1ST TRIMESTER 3/8 (MISCELLANEOUS) ×3 IMPLANT
KIT PINK PAD W/HEAD ARE REST (MISCELLANEOUS) ×3
KIT PINK PAD W/HEAD ARM REST (MISCELLANEOUS) ×1 IMPLANT
KIT TURNOVER CYSTO (KITS) ×3 IMPLANT
LABEL OR SOLS (LABEL) ×3 IMPLANT
NEEDLE VERESS 14GA 120MM (NEEDLE) ×3 IMPLANT
NS IRRIG 500ML POUR BTL (IV SOLUTION) ×3 IMPLANT
PACK DNC HYST (MISCELLANEOUS) ×3 IMPLANT
PACK GYN LAPAROSCOPIC (MISCELLANEOUS) ×3 IMPLANT
PAD OB MATERNITY 4.3X12.25 (PERSONAL CARE ITEMS) ×3 IMPLANT
PAD PREP 24X41 OB/GYN DISP (PERSONAL CARE ITEMS) ×3 IMPLANT
POUCH SPECIMEN RETRIEVAL 10MM (ENDOMECHANICALS) IMPLANT
SCISSORS METZENBAUM CVD 33 (INSTRUMENTS) ×3 IMPLANT
SET BERKELEY SUCTION TUBING (SUCTIONS) ×3 IMPLANT
SHEARS HARMONIC ACE PLUS 36CM (ENDOMECHANICALS) ×3 IMPLANT
SLEEVE ENDOPATH XCEL 5M (ENDOMECHANICALS) IMPLANT
SPONGE GAUZE 2X2 8PLY STER LF (GAUZE/BANDAGES/DRESSINGS)
SPONGE GAUZE 2X2 8PLY STRL LF (GAUZE/BANDAGES/DRESSINGS) IMPLANT
STRAP SAFETY 5IN WIDE (MISCELLANEOUS) ×3 IMPLANT
SUT VIC AB 0 CT1 36 (SUTURE) ×3 IMPLANT
SUT VIC AB 2-0 UR6 27 (SUTURE) IMPLANT
SUT VIC AB 4-0 PS2 18 (SUTURE) ×3 IMPLANT
SYR 10ML LL (SYRINGE) ×3 IMPLANT
TOWEL OR 17X26 4PK STRL BLUE (TOWEL DISPOSABLE) ×3 IMPLANT
TROCAR ENDO BLADELESS 11MM (ENDOMECHANICALS) IMPLANT
TROCAR XCEL NON-BLD 5MMX100MML (ENDOMECHANICALS) ×3 IMPLANT
TUBING INSUFFLATION (TUBING) ×3 IMPLANT
VACURETTE 10 RIGID CVD (CANNULA) IMPLANT
VACURETTE 12 RIGID CVD (CANNULA) IMPLANT
VACURETTE 8 RIGID CVD (CANNULA) ×3 IMPLANT

## 2017-11-08 NOTE — Op Note (Signed)
Gracelee Rowe Pavy PROCEDURE DATE: 11/08/2017  PREOPERATIVE DIAGNOSIS: Ruptured ectopic pregnancy POSTOPERATIVE DIAGNOSIS: Ruptured left fallopian tube ectopic pregnancy PROCEDURE: Laparoscopic bilateral salpingectomy and removal of ectopic pregnancy, Suction Dilation and curettage SURGEON:  Dr. Annamarie Major ANESTHESIOLOGIST: Piscitello, Cleda Mccreedy, MD  ANES: General  INDICATIONS: 32 y.o. [phone] with prior tubal ligation with recent pregnancy exemplified by rising beta hCG levels c/w ectopic pregnancy and pain and bleeding; here with the preoperative diagnoses as listed above.  Please refer to preoperative notes for more details. Patient was counseled regarding need for laparoscopic salpingectomy. Risks of surgery including bleeding which may require transfusion or reoperation, infection, injury to bowel or other surrounding organs, need for additional procedures including laparotomy and other postoperative/anesthesia complications were explained to patient.  Written informed consent was obtained.  FINDINGS:  Small amount of hemoperitoneum estimated to be about 50 mL of blood and clots.  Dilated left fallopian tube containing ectopic gestation. Small normal appearing uterus, normal right fallopian tube, right ovary and left ovary.   Evidence for tubal ligation on both sides.  ANESTHESIA: General ESTIMATED BLOOD LOSS: 100 ml URINE OUTPUT: 300 ml SPECIMENS: Both fallopian tube containing ectopic gestation; Endometrial curettings/suction   COMPLICATIONS: None immediate  PROCEDURE IN DETAIL:  The patient was taken to the operating room where general anesthesia was administered and was found to be adequate.  She was placed in the dorsal lithotomy position, and was prepped and draped in a sterile manner.  A Foley catheter was inserted into her bladder and attached to constant drainage and a sponge stick was placed vaginally for any manipulation purposes .  After an adequate timeout was performed, attention  was then turned to the patient's abdomen where a 5-mm skin incision was made on the umbilical fold.    After an adequate timeout was performed, attention was turned to the abdomen where an umbilical incision was made with the scalpel.  Veress needle is placed with confirmation using the hanging drop technique.   The abdomen was then insufflated with carbon dioxide gas and adequate pneumoperitoneum was obtained.  The 5-mm trocar and sleeve were then advanced without difficulty with the laparoscope under direct visualization into the abdomen.   A survey of the patient's pelvis and abdomen revealed the findings above.  A 5-mm right lower quadrant port was then placed under direct visualization. Additional 11-mm trocar placed in the Left Lower Quadrant. The suction irrigator was then used to suction the hemoperitoneum and irrigate the pelvis.  Attention was then turned to the left fallopian tube which was grasped and dissected/ ligated from the underlying mesosalpinx and uterine attachment using the Harmonic instrument.  Good hemostasis was noted.  The specimen was removed from the abdomen intact.  The right tube is also excised using the harmonic scapel.   Irrigation with aspiration of blood and fluid is done.  The left fascia is closed with a vicryl suture. The abdomen was desufflated, and all instruments were removed.  All skin incisions were closed Dermabond.   A suction D&C is performed.  The cervix is dilated to a 20 Pratt dilator and an 8 mm rigid suction curette is placed with aspiration of only a small amount of tissue and blood from uterus.  Addisional curettage is gently done w a banjo curette.  No bleeding noted afterwards.  The patient tolerated the procedure well.  All instruments, needles, and sponge counts were correct x 2. The patient was taken to the recovery room in stable condition.   The  patient will be discharged to home as per PACU criteria.  Routine postoperative instructions given.    Annamarie MajorPaul Xaivier Malay, MD, [facility] Frederick[facility]ACOG [facility], [facility] 11/08/2017  4:38 PM

## 2017-11-08 NOTE — Interval H&P Note (Signed)
History and Physical Interval Note:  11/08/2017 3:00 PM  Malesha Rowe PavyM Peltzer  has presented today for surgery, with the diagnosis of EPTOPIC  The various methods of treatment have been discussed with the patient and family. After consideration of risks, benefits and other options for treatment, the patient has consented to  Procedure(s): LAPAROSCOPIC BILATERAL SALPINGECTOMY (N/A) DILATATION AND EVACUATION (N/A) as a surgical intervention .  The patient's history has been reviewed, patient examined, no change in status, stable for surgery.  I have reviewed the patient's chart and labs.  Questions were answered to the patient's satisfaction.     Letitia Libraobert Paul Zymarion Favorite

## 2017-11-08 NOTE — Anesthesia Postprocedure Evaluation (Signed)
Anesthesia Post Note  Patient: Investment banker, corporateAmber M Lahaie  Procedure(s) Performed: LAPAROSCOPIC BILATERAL SALPINGECTOMY (N/A ) DILATATION AND EVACUATION (N/A )  Patient location during evaluation: PACU Anesthesia Type: General Level of consciousness: awake and alert Pain management: pain level controlled Vital Signs Assessment: post-procedure vital signs reviewed and stable Respiratory status: spontaneous breathing, nonlabored ventilation, respiratory function stable and patient connected to nasal cannula oxygen Cardiovascular status: blood pressure returned to baseline and stable Postop Assessment: no apparent nausea or vomiting Anesthetic complications: no     Last Vitals:  Vitals:   11/08/17 1736 11/08/17 1756  BP: 93/63 113/69  Pulse: 81 99  Resp: 20 16  Temp: (!) 36.2 C (!) 36.3 C  SpO2: 97% 100%    Last Pain:  Vitals:   11/08/17 1756  TempSrc: Temporal  PainSc: 5                  Cleda MccreedyJoseph K Arilynn Blakeney

## 2017-11-08 NOTE — Anesthesia Preprocedure Evaluation (Signed)
Anesthesia Evaluation  Patient identified by MRN, date of birth, ID band Patient awake    Reviewed: Allergy & Precautions, H&P , NPO status , Patient's Chart, lab work & pertinent test results  History of Anesthesia Complications Negative for: history of anesthetic complications  Airway Mallampati: II  TM Distance: >3 FB Neck ROM: full    Dental  (+) Chipped, Poor Dentition   Pulmonary neg pulmonary ROS, neg shortness of breath,           Cardiovascular Exercise Tolerance: Good (-) angina(-) Past MI and (-) DOE negative cardio ROS       Neuro/Psych PSYCHIATRIC DISORDERS Anxiety Depression negative neurological ROS     GI/Hepatic negative GI ROS, Neg liver ROS, neg GERD  ,  Endo/Other  negative endocrine ROS  Renal/GU      Musculoskeletal   Abdominal   Peds  Hematology negative hematology ROS (+)   Anesthesia Other Findings Past Medical History: No date: Anxiety No date: Depression  Past Surgical History: 04/19/2015: TUBAL LIGATION; Bilateral     Comment:  Procedure: POST PARTUM TUBAL LIGATION;  Surgeon: [place] Bingharlie              Pickens, MD;  Location: [facility] ORS;  Service: Gynecology;                Laterality: Bilateral; No date: WISDOM TOOTH EXTRACTION  BMI    Body Mass Index:  27.43 kg/m      Reproductive/Obstetrics negative OB ROS                             Anesthesia Physical Anesthesia Plan  ASA: II  Anesthesia Plan: General ETT   Post-op Pain Management:    Induction: Intravenous  PONV Risk Score and Plan: Ondansetron, Dexamethasone and Midazolam  Airway Management Planned: Oral ETT  Additional Equipment:   Intra-op Plan:   Post-operative Plan: Extubation in OR  Informed Consent: I have reviewed the patients History and Physical, chart, labs and discussed the procedure including the risks, benefits and alternatives for the proposed anesthesia with the patient  or authorized representative who has indicated his/her understanding and acceptance.   Dental Advisory Given  Plan Discussed with: Anesthesiologist, CRNA and Surgeon  Anesthesia Plan Comments: (Patient consented for risks of anesthesia including but not limited to:  - adverse reactions to medications - damage to teeth, lips or other oral mucosa - sore throat or hoarseness - Damage to heart, brain, lungs or loss of life  Patient voiced understanding.)        Anesthesia Quick Evaluation

## 2017-11-08 NOTE — Anesthesia Procedure Notes (Signed)
Procedure Name: Intubation Date/Time: 11/08/2017 3:36 PM Performed by: Dionne Bucy, CRNA Pre-anesthesia Checklist: Patient identified, Patient being monitored, Timeout performed, Emergency Drugs available and Suction available Patient Re-evaluated:Patient Re-evaluated prior to induction Oxygen Delivery Method: Circle system utilized Preoxygenation: Pre-oxygenation with 100% oxygen Induction Type: IV induction Ventilation: Mask ventilation without difficulty Laryngoscope Size: Mac and 3 Grade View: Grade I Tube type: Oral Tube size: 7.0 mm Number of attempts: 1 Airway Equipment and Method: Stylet Placement Confirmation: ETT inserted through vocal cords under direct vision,  positive ETCO2 and breath sounds checked- equal and bilateral Secured at: 22 cm Tube secured with: Tape Dental Injury: Teeth and Oropharynx as per pre-operative assessment

## 2017-11-08 NOTE — Discharge Instructions (Signed)
AMBULATORY SURGERY  °DISCHARGE INSTRUCTIONS ° ° °1) The drugs that you were given will stay in your system until tomorrow so for the next 24 hours you should not: ° °A) Drive an automobile °B) Make any legal decisions °C) Drink any alcoholic beverage ° ° °2) You may resume regular meals tomorrow.  Today it is better to start with liquids and gradually work up to solid foods. ° °You may eat anything you prefer, but it is better to start with liquids, then soup and crackers, and gradually work up to solid foods. ° ° °3) Please notify your doctor immediately if you have any unusual bleeding, trouble breathing, redness and pain at the surgery site, drainage, fever, or pain not relieved by medication. °4)  ° °5) Your post-operative visit with Dr.                     °           °     is: Date:                        Time:   ° °Please call to schedule your post-operative visit. ° °6) Additional Instructions: ° ° ° ° °General Gynecological Post-Operative Instructions °You may expect to feel dizzy, weak, and drowsy for as long as 24 hours after receiving the medicine that made you sleep (anesthetic).  °Do not drive a car, ride a bicycle, participate in physical activities, or take public transportation until you are done taking narcotic pain medicines or as directed by your doctor.  °Do not drink alcohol or take tranquilizers.  °Do not take medicine that has not been prescribed by your doctor.  °Do not sign important papers or make important decisions while on narcotic pain medicines.  °Have a responsible person with you.  °CARE OF INCISION  °Keep incision clean and dry. °Take showers instead of baths until your doctor gives you permission to take baths.  °Avoid heavy lifting (more than 10 pounds/4.5 kilograms), pushing, or pulling.  °Avoid activities that may risk injury to your surgical site.  °No sexual intercourse or placement of anything in the vagina for 2 weeks or as instructed by your doctor. °If you have tubes  coming from the wound site, check with your doctor regarding appropriate care of the tubes. °Only take prescription or over-the-counter medicines  for pain, discomfort, or fever as directed by your doctor. Do not take aspirin. It can make you bleed. Take medicines (antibiotics) that kill germs if they are prescribed for you.  °Call the office or go to the ER if:  °You feel sick to your stomach (nauseous) and you start to throw up (vomit).  °You have trouble eating or drinking.  °You have an oral temperature above 101.  °You have constipation that is not helped by adjusting diet or increasing fluid intake. Pain medicines are a common cause of constipation.  °You have any other concerns. °SEEK IMMEDIATE MEDICAL CARE IF:  °You have persistent dizziness.  °You have difficulty breathing or a congested sounding (croupy) cough.  °You have an oral temperature above 102.5, not controlled by medicine.  °There is increasing pain or tenderness near or in the surgical site.  ° ° ° °

## 2017-11-08 NOTE — Transfer of Care (Signed)
Immediate Anesthesia Transfer of Care Note  Patient: Brenda Cohen  Procedure(s) Performed: LAPAROSCOPIC BILATERAL SALPINGECTOMY (N/A ) DILATATION AND EVACUATION (N/A )  Patient Location: PACU  Anesthesia Type:General  Level of Consciousness: sedated  Airway & Oxygen Therapy: Patient Spontanous Breathing and Patient connected to face mask oxygen  Post-op Assessment: Report given to RN and Post -op Vital signs reviewed and stable  Post vital signs: Reviewed and stable  Last Vitals:  Vitals Value Taken Time  BP 96/82 11/08/2017  4:51 PM  Temp    Pulse 108 11/08/2017  4:53 PM  Resp 23 11/08/2017  4:53 PM  SpO2 100 % 11/08/2017  4:53 PM  Vitals shown include unvalidated device data.  Last Pain:  Vitals:   11/08/17 1246  TempSrc: Tympanic  PainSc: 2          Complications: No apparent anesthesia complications

## 2017-11-08 NOTE — Anesthesia Post-op Follow-up Note (Signed)
Anesthesia QCDR form completed.        

## 2017-11-09 ENCOUNTER — Encounter: Payer: Self-pay | Admitting: Obstetrics & Gynecology

## 2017-11-09 ENCOUNTER — Telehealth: Payer: Self-pay

## 2017-11-09 NOTE — Telephone Encounter (Signed)
Pain is on left side, pain is a 10. No other symptoms. Pt aware of Alleve or Naproxen

## 2017-11-09 NOTE — Telephone Encounter (Signed)
Percocet was prescribed yesterday, and it is our stronger medicine.  Advise to take Alleve/ Naproxen every 4-6 hours in addition to Percocet.   Where is her pain? Any other sx's?

## 2017-11-09 NOTE — Telephone Encounter (Signed)
[facility]. Pain meds are not helping. Requesting something stronger. Cb#[phone]

## 2017-11-13 LAB — SURGICAL PATHOLOGY

## 2017-11-23 ENCOUNTER — Ambulatory Visit (INDEPENDENT_AMBULATORY_CARE_PROVIDER_SITE_OTHER): Payer: Medicaid Other | Admitting: Obstetrics & Gynecology

## 2017-11-23 ENCOUNTER — Encounter: Payer: Self-pay | Admitting: Obstetrics & Gynecology

## 2017-11-23 VITALS — BP 100/70 | Ht 62.0 in | Wt 143.0 lb

## 2017-11-23 DIAGNOSIS — O00101 Right tubal pregnancy without intrauterine pregnancy: Secondary | ICD-10-CM

## 2017-11-23 DIAGNOSIS — O00102 Left tubal pregnancy without intrauterine pregnancy: Secondary | ICD-10-CM

## 2017-11-23 DIAGNOSIS — K661 Hemoperitoneum: Secondary | ICD-10-CM

## 2017-11-23 NOTE — Progress Notes (Signed)
  Postoperative Follow-up Patient presents post op from laparoscopy for salpingectomy due to ectopic pregnancy, 2 weeks ago.  Subjective: Patient reports marked improvement in her preop symptoms. Eating a regular diet without difficulty. The patient is not having any pain.  Activity: normal activities of daily living. Patient reports vaginal sx's of None  Objective: BP 100/70   Ht 5\' 2"  (1.575 m)   Wt 143 lb (64.9 kg)   BMI 26.16 kg/m  Physical Exam  Constitutional: She is oriented to person, place, and time. She appears well-developed and well-nourished. No distress.  Cardiovascular: Normal rate.  Pulmonary/Chest: Effort normal.  Abdominal: Soft. She exhibits no distension. There is no tenderness.  Incision Healing Well   Musculoskeletal: Normal range of motion.  Neurological: She is alert and oriented to person, place, and time. No cranial nerve deficit.  Skin: Skin is warm and dry.  Psychiatric: She has a normal mood and affect.    Assessment: s/p :  laparoscopy and bilateral salpingectomy after Ectopic pregnancy (with prior BTL) progressing well  Plan: Patient has done well after surgery with no apparent complications.  I have discussed the post-operative course to date, and the expected progress moving forward.  The patient understands what complications to be concerned about.  I will see the patient in routine follow up, or sooner if needed.    Activity plan: No heavy lifting.  PAP soon  Letitia LibraRobert Paul Fender Herder 11/23/2017, 2:29 PM

## 2018-01-21 ENCOUNTER — Ambulatory Visit: Payer: Medicaid Other | Admitting: Obstetrics & Gynecology

## 2018-11-30 ENCOUNTER — Emergency Department: Payer: 59

## 2018-11-30 ENCOUNTER — Encounter: Payer: Self-pay | Admitting: Emergency Medicine

## 2018-11-30 ENCOUNTER — Other Ambulatory Visit: Payer: Self-pay

## 2018-11-30 ENCOUNTER — Emergency Department
Admission: EM | Admit: 2018-11-30 | Discharge: 2018-11-30 | Disposition: A | Discharge status: Home / Self Care | Payer: 59 | Attending: Emergency Medicine | Admitting: Emergency Medicine

## 2018-11-30 DIAGNOSIS — Y929 Unspecified place or not applicable: Secondary | ICD-10-CM | POA: Diagnosis not present

## 2018-11-30 DIAGNOSIS — Y93K1 Activity, walking an animal: Secondary | ICD-10-CM | POA: Diagnosis not present

## 2018-11-30 DIAGNOSIS — Y999 Unspecified external cause status: Secondary | ICD-10-CM | POA: Diagnosis not present

## 2018-11-30 DIAGNOSIS — W010XXA Fall on same level from slipping, tripping and stumbling without subsequent striking against object, initial encounter: Secondary | ICD-10-CM | POA: Insufficient documentation

## 2018-11-30 DIAGNOSIS — S4992XA Unspecified injury of left shoulder and upper arm, initial encounter: Secondary | ICD-10-CM | POA: Diagnosis present

## 2018-11-30 DIAGNOSIS — S42012A Anterior displaced fracture of sternal end of left clavicle, initial encounter for closed fracture: Secondary | ICD-10-CM

## 2018-11-30 MED ORDER — IBUPROFEN 600 MG PO TABS: 600.0000 mg | ORAL_TABLET | Freq: Three times a day (TID) | ORAL | 0 refills | Status: DC | PRN

## 2018-11-30 MED ORDER — OXYCODONE-ACETAMINOPHEN 7.5-325 MG PO TABS: 1.0000 | ORAL_TABLET | Freq: Four times a day (QID) | ORAL | 0 refills | Status: DC | PRN

## 2018-11-30 MED ORDER — HYDROMORPHONE HCL 1 MG/ML IJ SOLN
1.0000 mg | Freq: Once | INTRAMUSCULAR | Status: AC
  Administered 2018-11-30: 1 mg via INTRAMUSCULAR
  Filled 2018-11-30: qty 1

## 2018-11-30 NOTE — ED Triage Notes (Signed)
Pt arrived via POV with reports of left shoulder pain after falling after tripping over a dog, pt tearful and crying in triage.

## 2018-11-30 NOTE — ED Notes (Addendum)
Pt states "I feel great but I'm still a 10/10 when I try to move my arm or breathe." Then pt asked to try and move arm. Pt stated "Okay maybe not as much anymore. Maybe an 8/10." Sling applied to injured arm.

## 2018-11-30 NOTE — ED Notes (Signed)
Pt to room now.

## 2018-11-30 NOTE — ED Provider Notes (Signed)
[facility] Emergency Department Provider Note   ____________________________________________   First MD Initiated Contact with Patient 11/30/18 1945     (approximate)  I have reviewed the triage vital signs and the nursing notes.   HISTORY  Chief Complaint Shoulder Pain    HPI Brenda Cohen is a 33 y.o. female patient complain of left shoulder pain secondary to tripping over her dog while walking him today.  Patient states increased pain with aduction/oxygen of the shoulder.  Patient denies loss of sensation.  Patient rates pain as a 10/10.  No palliative measures prior to arrival.         Past Medical History:  Diagnosis Date  . Anxiety   . Depression     Patient Active Problem List   Diagnosis Date Noted  . Ruptured left tubal ectopic pregnancy causing hemoperitoneum 11/08/2017  . MDD (major depressive disorder), recurrent episode, mild ([place]) 01/08/2015  . Anxiety state 01/08/2015  . Major depressive disorder, recurrent episode, mild ([place]) 12/17/2014    Past Surgical History:  Procedure Laterality Date  . DILATION AND EVACUATION N/A 11/08/2017   Procedure: DILATATION AND EVACUATION;  Surgeon: Gae Dry, MD;  Location: [facility] ORS;  Service: Gynecology;  Laterality: N/A;  . LAPAROSCOPIC BILATERAL SALPINGECTOMY N/A 11/08/2017   Procedure: LAPAROSCOPIC BILATERAL SALPINGECTOMY;  Surgeon: Gae Dry, MD;  Location: [facility] ORS;  Service: Gynecology;  Laterality: N/A;  . TUBAL LIGATION Bilateral 04/19/2015   Procedure: POST PARTUM TUBAL LIGATION;  Surgeon: Aletha Halim, MD;  Location: [facility] ORS;  Service: Gynecology;  Laterality: Bilateral;  . WISDOM TOOTH EXTRACTION      Prior to Admission medications   Medication Sig Start Date End Date Taking? Authorizing Provider  acetaminophen (TYLENOL) 500 MG tablet Take 1,000 mg by mouth every 6 (six) hours as needed for moderate pain.    [provider]  ibuprofen (ADVIL) 600 MG  tablet Take 1 tablet (600 mg total) by mouth every 8 (eight) hours as needed. 11/30/18   Sable Feil, PA-C  oxyCODONE-acetaminophen (PERCOCET) 7.5-325 MG tablet Take 1 tablet by mouth every 6 (six) hours as needed. 11/30/18   Sable Feil, PA-C  oxyCODONE-acetaminophen (PERCOCET/ROXICET) 5-325 MG tablet Take 1 tablet by mouth every 3 (three) hours as needed for moderate pain. Patient not taking: Reported on 11/23/2017 11/08/17   Gae Dry, MD    Allergies Bactrim [sulfamethoxazole-trimethoprim]  Family History  Problem Relation Age of Onset  . Drug abuse Mother   . Hyperlipidemia Father   . Hypertension Father   . Depression Father   . Migraines Sister   . Bipolar disorder Paternal Uncle   . Schizophrenia Paternal Uncle   . OCD Paternal Uncle     Social History Social History   Tobacco Use  . Smoking status: Never Smoker  . Smokeless tobacco: Never Used  Substance Use Topics  . Alcohol use: Yes    Alcohol/week: 1.0 standard drinks    Types: 1 Glasses of wine per week  . Drug use: Yes    Types: Marijuana    Comment: not since she pregnant    Review of Systems  Constitutional: No fever/chills Eyes: No visual changes. ENT: No sore throat. Cardiovascular: Denies chest pain. Respiratory: Denies shortness of breath. Gastrointestinal: No abdominal pain.  No nausea, no vomiting.  No diarrhea.  No constipation. Genitourinary: Negative for dysuria. Musculoskeletal: Shoulder pain.   Skin: Negative for rash. Neurological: Negative for headaches, focal weakness or numbness. Psychiatric: Denies depression.  Allergic/Immunilogical: Sulfur. ____________________________________________   PHYSICAL EXAM:  VITAL SIGNS: ED Triage Vitals  Enc Vitals Group     BP 11/30/18 1743 (!) 150/119     Pulse Rate 11/30/18 1743 (!) 115     Resp 11/30/18 1743 (!) 22     Temp 11/30/18 1743 99.4 F (37.4 C)     Temp Source 11/30/18 1743 Oral     SpO2 11/30/18 1743 96 %     Weight  11/30/18 1742 165 lb 5.5 oz (75 kg)     Height 11/30/18 1742 5\' 2"  (1.575 m)     Head Circumference --      Peak Flow --      Pain Score 11/30/18 1742 10     Pain Loc --      Pain Edu? --      Excl. in GC? --     Constitutional: Alert and oriented.  Moderate distress.  Crying.  Neck: No cervical spine tenderness to palpation. Hematological/Lymphatic/Immunilogical: No cervical lymphadenopathy. Cardiovascular: Normal rate, regular rhythm. Grossly normal heart sounds.  Good peripheral circulation. Respiratory: Normal respiratory effort.  No retractions. Lungs CTAB. Gastrointestinal: Soft and nontender. No distention. No abdominal bruits. No CVA tenderness. Musculoskeletal: Obvious deformity to left clavicle.  Neurologic:  Normal speech and language. No gross focal neurologic deficits are appreciated. No gait instability. Skin:  Skin is warm, dry and intact. No rash noted. Psychiatric: Mood and affect are normal. Speech and behavior are normal.  ____________________________________________   LABS (all labs ordered are listed, but only abnormal results are displayed)  Labs Reviewed - No data to display ____________________________________________  EKG   ____________________________________________  RADIOLOGY  ED MD interpretation:    Official radiology report(s): Dg Clavicle Left  Result Date: 11/30/2018 CLINICAL DATA:  Fall, pain EXAM: LEFT CLAVICLE - 2+ VIEWS COMPARISON:  None. FINDINGS: There is a transverse fracture of the proximal third of the left clavicle with elevation of the proximal fragment. The left acromioclavicular joint appears intact. IMPRESSION: There is a transverse fracture of the proximal third of the left clavicle with elevation of the proximal fragment. The left acromioclavicular joint appears intact. Electronically Signed   By: Lauralyn PrimesAlex  Bibbey M.D.   On: 11/30/2018 18:28    ____________________________________________   PROCEDURES  Procedure(s) performed  (including Critical Care):  Procedures   ____________________________________________   INITIAL IMPRESSION / ASSESSMENT AND PLAN / ED COURSE  As part of my medical decision making, I reviewed the following data within the electronic MEDICAL RECORD NUMBER         Brenda Cohen was evaluated in Emergency Department on 11/30/2018 for the symptoms described in the history of present illness. She was evaluated in the context of the global COVID-19 pandemic, which necessitated consideration that the patient might be at risk for infection with the SARS-CoV-2 virus that causes COVID-19. Institutional protocols and algorithms that pertain to the evaluation of patients at risk for COVID-19 are in a state of rapid change based on information released by regulatory bodies including the CDC and federal and state organizations. These policies and algorithms were followed during the patient's care in the ED.    Patient presents with left shoulder pain secondary to a trip and fall.  Discussed x-ray findings with patient showing a displaced left clavicle fracture.  Patient placed in a sling and given discharge care instruction.  Patient follow-up orthopedics by calling for an appointment in 2 days.  Take medication as directed.   ____________________________________________   FINAL CLINICAL  IMPRESSION(S) / ED DIAGNOSES  Final diagnoses:  Anterior displaced fracture of sternal end of left clavicle, initial encounter for closed fracture     ED Discharge Orders         Ordered    oxyCODONE-acetaminophen (PERCOCET) 7.5-325 MG tablet  Every 6 hours PRN     11/30/18 2013    ibuprofen (ADVIL) 600 MG tablet  Every 8 hours PRN     11/30/18 2013           Note:  This document was prepared using Dragon voice recognition software and may include unintentional dictation errors.    Joni ReiningSmith, Deroy Noah K, PA-C 11/30/18 2018    Shaune PollackIsaacs, [place], MD 12/01/18 [phone]

## 2018-11-30 NOTE — ED Notes (Signed)
First Nurse Note: Pt yelling and cussing in the lobby, RN reminded pt that this is not appropriate as there are other pts waiting in the lobby, pt continued to yell and cuss stating "it fucking hurts".

## 2018-11-30 NOTE — Discharge Instructions (Addendum)
Your clavicle fracture needs to be evaluated by orthopedics.  Wear sling and take pain medication as directed.  Call orthopedic clinic Monday morning and tell them you follow-up in the emergency room and they will schedule you to come in for evaluation and treatment.

## 2018-11-30 NOTE — ED Notes (Signed)
Pt sitting in chair in hallway between triage one and two; officer on duty has already spoken to patient about her cursing; pt remains argumentative; pt talking loudly on cell phone with someone, continues to curse; pt waiting for treatment room in flex

## 2018-11-30 NOTE — ED Notes (Signed)
Pt not yet moved to room. Held up in triage momentarily.

## 2018-11-30 NOTE — ED Notes (Signed)
While this nurse was talking to another pt in the flex waiting room regarding a family member she was checking on, Eye jumped in yelling and cussing calling me a bitch. When asked to stop especially since there were two young pts in the waiting room as well, she continued to escalate saying if was her freedom of speech to cuss and call me a bitch. The officer was called who tried to speak with her and wound up having to move her to the middle chair in triage.

## 2018-12-03 ENCOUNTER — Other Ambulatory Visit
Admission: RE | Admit: 2018-12-03 | Discharge: 2018-12-03 | Disposition: A | Discharge status: Home / Self Care | Payer: 59 | Source: Ambulatory Visit | Attending: Surgery | Admitting: Surgery

## 2018-12-03 ENCOUNTER — Other Ambulatory Visit: Payer: Self-pay

## 2018-12-03 ENCOUNTER — Encounter
Admission: RE | Admit: 2018-12-03 | Discharge: 2018-12-03 | Disposition: A | Discharge status: Home / Self Care | Payer: 59 | Source: Ambulatory Visit | Attending: Surgery | Admitting: Surgery

## 2018-12-03 DIAGNOSIS — Z1159 Encounter for screening for other viral diseases: Secondary | ICD-10-CM | POA: Insufficient documentation

## 2018-12-03 DIAGNOSIS — Z01812 Encounter for preprocedural laboratory examination: Secondary | ICD-10-CM | POA: Diagnosis present

## 2018-12-03 LAB — SARS CORONAVIRUS 2 BY RT PCR (HOSPITAL ORDER, PERFORMED IN ~~LOC~~ HOSPITAL LAB): SARS Coronavirus 2: NEGATIVE

## 2018-12-03 NOTE — Patient Instructions (Signed)
Your procedure is scheduled on:12/05/18 Report to Day Surgery.  [facility] SECOND FLOOR To find out your arrival time please call [phone] between 1PM - 3PM on 12/04/18.  Remember: Instructions that are not followed completely may result in serious medical risk,  up to and including death, or upon the discretion of your surgeon and anesthesiologist your  surgery may need to be rescheduled.     _X__ 1. Do not eat food after midnight the night before your procedure.                 No gum chewing or hard candies. You may drink clear liquids up to 2 hours                 before you are scheduled to arrive for your surgery- DO not drink clear                 liquids within 2 hours of the start of your surgery.                 Clear Liquids include:  water, apple juice without pulp, clear carbohydrate                 drink such as Clearfast of Gatorade, Black Coffee or Tea (Do not add                 anything to coffee or tea).  __X__2.  On the morning of surgery brush your teeth with toothpaste and water, you                may rinse your mouth with mouthwash if you wish.  Do not swallow any toothpaste of mouthwash.     _X__ 3.  No Alcohol for 24 hours before or after surgery.   _X__ 4.  Do Not Smoke or use e-cigarettes For 24 Hours Prior to Your Surgery.                 Do not use any chewable tobacco products for at least 6 hours prior to                 surgery.  ____  5.  Bring all medications with you on the day of surgery if instructed.   ___X_  6.  Notify your doctor if there is any change in your medical condition      (cold, fever, infections).     Do not wear jewelry, make-up, hairpins, clips or nail polish. Do not wear lotions, powders, or perfumes. You may wear deodorant. Do not shave 48 hours prior to surgery. Men may shave face and neck. Do not bring valuables to the hospital.    [facility] is not responsible for any belongings or  valuables.  Contacts, dentures or bridgework may not be worn into surgery. Leave your suitcase in the car. After surgery it may be brought to your room. For patients admitted to the hospital, discharge time is determined by your treatment team.   Patients discharged the day of surgery will not be allowed to drive home.           ____ Take these medicines the morning of surgery with A SIP OF WATER:    1. NONE  2.   3.   4.  5.  6.  ____ Fleet Enema (as directed)   ____ Use CHG Soap as directed  ____ Use inhalers on the day of surgery  ____ Stop   metformin 2 days prior to surgery    ____ Take 1/2 of usual insulin dose the night before surgery. No insulin the morning          of surgery.   ____ Stop Coumadin/Plavix/aspirin on   ____ Stop Anti-inflammatories on    ____ Stop supplements until after surgery.    ____ Bring C-Pap to the hospital.                                

## 2018-12-04 MED ORDER — CEFAZOLIN SODIUM-DEXTROSE 2-4 GM/100ML-% IV SOLN
2.0000 g | Freq: Once | INTRAVENOUS | Status: AC
  Administered 2018-12-05: 14:00:00 2 g via INTRAVENOUS

## 2018-12-05 ENCOUNTER — Encounter
Admission: RE | Disposition: A | Discharge status: Home / Self Care | Payer: Self-pay | Source: Home / Self Care | Attending: Surgery

## 2018-12-05 ENCOUNTER — Ambulatory Visit: Payer: 59

## 2018-12-05 ENCOUNTER — Encounter: Payer: Self-pay | Admitting: *Deleted

## 2018-12-05 ENCOUNTER — Other Ambulatory Visit: Payer: Self-pay

## 2018-12-05 ENCOUNTER — Ambulatory Visit: Payer: 59 | Admitting: Anesthesiology

## 2018-12-05 ENCOUNTER — Ambulatory Visit
Admission: RE | Admit: 2018-12-05 | Discharge: 2018-12-05 | Disposition: A | Discharge status: Home / Self Care | Payer: 59 | Attending: Surgery | Admitting: Surgery

## 2018-12-05 DIAGNOSIS — X58XXXA Exposure to other specified factors, initial encounter: Secondary | ICD-10-CM | POA: Diagnosis not present

## 2018-12-05 DIAGNOSIS — S42022A Displaced fracture of shaft of left clavicle, initial encounter for closed fracture: Secondary | ICD-10-CM | POA: Insufficient documentation

## 2018-12-05 DIAGNOSIS — F17210 Nicotine dependence, cigarettes, uncomplicated: Secondary | ICD-10-CM | POA: Insufficient documentation

## 2018-12-05 DIAGNOSIS — Z419 Encounter for procedure for purposes other than remedying health state, unspecified: Secondary | ICD-10-CM

## 2018-12-05 HISTORY — PX: ORIF CLAVICULAR FRACTURE: SHX5055

## 2018-12-05 LAB — URINE DRUG SCREEN, QUALITATIVE (ARMC ONLY)
Amphetamines, Ur Screen: NOT DETECTED
Barbiturates, Ur Screen: NOT DETECTED
Benzodiazepine, Ur Scrn: NOT DETECTED
Cannabinoid 50 Ng, Ur ~~LOC~~: POSITIVE — AB
Cocaine Metabolite,Ur ~~LOC~~: NOT DETECTED
MDMA (Ecstasy)Ur Screen: NOT DETECTED
Methadone Scn, Ur: NOT DETECTED
Opiate, Ur Screen: NOT DETECTED
Phencyclidine (PCP) Ur S: NOT DETECTED
Tricyclic, Ur Screen: NOT DETECTED

## 2018-12-05 LAB — POCT PREGNANCY, URINE: Preg Test, Ur: NEGATIVE

## 2018-12-05 SURGERY — OPEN REDUCTION INTERNAL FIXATION (ORIF) CLAVICULAR FRACTURE
Anesthesia: General | Laterality: Left

## 2018-12-05 MED ORDER — METOCLOPRAMIDE HCL 10 MG PO TABS: 5.0000 mg | ORAL_TABLET | Freq: Three times a day (TID) | ORAL | Status: DC | PRN

## 2018-12-05 MED ORDER — FENTANYL CITRATE (PF) 100 MCG/2ML IJ SOLN
INTRAMUSCULAR | Status: AC
  Filled 2018-12-05: qty 2

## 2018-12-05 MED ORDER — FENTANYL CITRATE (PF) 100 MCG/2ML IJ SOLN
INTRAMUSCULAR | Status: DC | PRN
  Administered 2018-12-05: 100 ug via INTRAVENOUS

## 2018-12-05 MED ORDER — HYDROMORPHONE HCL 1 MG/ML IJ SOLN
INTRAMUSCULAR | Status: DC | PRN
  Administered 2018-12-05: .5 mg via INTRAVENOUS

## 2018-12-05 MED ORDER — DEXAMETHASONE SODIUM PHOSPHATE 10 MG/ML IJ SOLN
INTRAMUSCULAR | Status: DC | PRN
  Administered 2018-12-05: 5 mg via INTRAVENOUS

## 2018-12-05 MED ORDER — SUGAMMADEX SODIUM 500 MG/5ML IV SOLN
INTRAVENOUS | Status: DC | PRN
  Administered 2018-12-05: 150 mg via INTRAVENOUS

## 2018-12-05 MED ORDER — SUGAMMADEX SODIUM 200 MG/2ML IV SOLN
INTRAVENOUS | Status: AC
  Filled 2018-12-05: qty 2

## 2018-12-05 MED ORDER — PROPOFOL 10 MG/ML IV BOLUS
INTRAVENOUS | Status: DC | PRN
  Administered 2018-12-05: 150 mg via INTRAVENOUS
  Administered 2018-12-05: 40 mg via INTRAVENOUS

## 2018-12-05 MED ORDER — ONDANSETRON HCL 4 MG PO TABS: 4.0000 mg | ORAL_TABLET | Freq: Four times a day (QID) | ORAL | Status: DC | PRN

## 2018-12-05 MED ORDER — ONDANSETRON HCL 4 MG/2ML IJ SOLN
INTRAMUSCULAR | Status: DC | PRN
  Administered 2018-12-05: 4 mg via INTRAVENOUS

## 2018-12-05 MED ORDER — METOCLOPRAMIDE HCL 5 MG/ML IJ SOLN: 5.0000 mg | Freq: Three times a day (TID) | INTRAMUSCULAR | Status: DC | PRN

## 2018-12-05 MED ORDER — ACETAMINOPHEN 10 MG/ML IV SOLN
INTRAVENOUS | Status: DC | PRN
  Administered 2018-12-05: 1000 mg via INTRAVENOUS

## 2018-12-05 MED ORDER — BUPIVACAINE-EPINEPHRINE (PF) 0.25% -1:200000 IJ SOLN
INTRAMUSCULAR | Status: AC
  Filled 2018-12-05: qty 30

## 2018-12-05 MED ORDER — KETOROLAC TROMETHAMINE 30 MG/ML IJ SOLN
INTRAMUSCULAR | Status: DC | PRN
  Administered 2018-12-05: 30 mg via INTRAVENOUS

## 2018-12-05 MED ORDER — FAMOTIDINE 20 MG PO TABS
ORAL_TABLET | ORAL | Status: AC
  Administered 2018-12-05: 12:00:00 20 mg via ORAL
  Filled 2018-12-05: qty 1

## 2018-12-05 MED ORDER — PROMETHAZINE HCL 25 MG/ML IJ SOLN: 6.2500 mg | INTRAMUSCULAR | Status: DC | PRN

## 2018-12-05 MED ORDER — LACTATED RINGERS IV SOLN
INTRAVENOUS | Status: DC
  Administered 2018-12-05: 11:00:00 via INTRAVENOUS

## 2018-12-05 MED ORDER — LIDOCAINE HCL (CARDIAC) PF 100 MG/5ML IV SOSY
PREFILLED_SYRINGE | INTRAVENOUS | Status: DC | PRN
  Administered 2018-12-05: 50 mg via INTRAVENOUS

## 2018-12-05 MED ORDER — DEXAMETHASONE SODIUM PHOSPHATE 10 MG/ML IJ SOLN
INTRAMUSCULAR | Status: AC
  Filled 2018-12-05: qty 1

## 2018-12-05 MED ORDER — MIDAZOLAM HCL 2 MG/2ML IJ SOLN
INTRAMUSCULAR | Status: DC | PRN
  Administered 2018-12-05: 2 mg via INTRAVENOUS

## 2018-12-05 MED ORDER — OXYCODONE HCL 5 MG PO TABS: 5.0000 mg | ORAL_TABLET | ORAL | Status: DC | PRN

## 2018-12-05 MED ORDER — ONDANSETRON HCL 4 MG/2ML IJ SOLN
INTRAMUSCULAR | Status: AC
  Filled 2018-12-05: qty 2

## 2018-12-05 MED ORDER — OXYCODONE HCL 5 MG PO TABS: 5.0000 mg | ORAL_TABLET | ORAL | 0 refills | Status: DC | PRN

## 2018-12-05 MED ORDER — POTASSIUM CHLORIDE IN NACL 20-0.9 MEQ/L-% IV SOLN: INTRAVENOUS | Status: DC

## 2018-12-05 MED ORDER — BUPIVACAINE-EPINEPHRINE 0.25% -1:200000 IJ SOLN
INTRAMUSCULAR | Status: DC | PRN
  Administered 2018-12-05: 30 mL

## 2018-12-05 MED ORDER — PHENYLEPHRINE HCL (PRESSORS) 10 MG/ML IV SOLN
INTRAVENOUS | Status: DC | PRN
  Administered 2018-12-05: 50 ug via INTRAVENOUS

## 2018-12-05 MED ORDER — OXYCODONE-ACETAMINOPHEN 7.5-325 MG PO TABS: 1.0000 | ORAL_TABLET | Freq: Four times a day (QID) | ORAL | 0 refills | Status: DC | PRN

## 2018-12-05 MED ORDER — ROCURONIUM BROMIDE 100 MG/10ML IV SOLN
INTRAVENOUS | Status: DC | PRN
  Administered 2018-12-05: 40 mg via INTRAVENOUS
  Administered 2018-12-05: 10 mg via INTRAVENOUS

## 2018-12-05 MED ORDER — FAMOTIDINE 20 MG PO TABS
20.0000 mg | ORAL_TABLET | Freq: Once | ORAL | Status: AC
  Administered 2018-12-05: 12:00:00 20 mg via ORAL

## 2018-12-05 MED ORDER — HYDROMORPHONE HCL 1 MG/ML IJ SOLN
INTRAMUSCULAR | Status: AC
  Filled 2018-12-05: qty 1

## 2018-12-05 MED ORDER — MIDAZOLAM HCL 2 MG/2ML IJ SOLN
INTRAMUSCULAR | Status: AC
  Filled 2018-12-05: qty 2

## 2018-12-05 MED ORDER — ONDANSETRON HCL 4 MG/2ML IJ SOLN: 4.0000 mg | Freq: Four times a day (QID) | INTRAMUSCULAR | Status: DC | PRN

## 2018-12-05 MED ORDER — DEXMEDETOMIDINE HCL IN NACL 200 MCG/50ML IV SOLN
INTRAVENOUS | Status: DC | PRN
  Administered 2018-12-05 (×3): 4 ug via INTRAVENOUS

## 2018-12-05 MED ORDER — HYDROMORPHONE HCL 1 MG/ML IJ SOLN
INTRAMUSCULAR | Status: AC
  Administered 2018-12-05: 0.25 mg via INTRAVENOUS
  Filled 2018-12-05: qty 1

## 2018-12-05 MED ORDER — PROPOFOL 10 MG/ML IV BOLUS
INTRAVENOUS | Status: AC
  Filled 2018-12-05: qty 40

## 2018-12-05 MED ORDER — HYDROMORPHONE HCL 1 MG/ML IJ SOLN
0.2500 mg | INTRAMUSCULAR | Status: DC | PRN
  Administered 2018-12-05 (×2): 0.25 mg via INTRAVENOUS

## 2018-12-05 MED ORDER — CEFAZOLIN SODIUM-DEXTROSE 2-4 GM/100ML-% IV SOLN
INTRAVENOUS | Status: AC
  Filled 2018-12-05: qty 100

## 2018-12-05 MED ORDER — LIDOCAINE HCL (PF) 2 % IJ SOLN
INTRAMUSCULAR | Status: AC
  Filled 2018-12-05: qty 10

## 2018-12-05 SURGICAL SUPPLY — 43 items
BIT DRILL 2.8X5 QR DISP (BIT) ×1 IMPLANT
BNDG COHESIVE 4X5 TAN STRL (GAUZE/BANDAGES/DRESSINGS) ×2 IMPLANT
CANISTER SUCT 1200ML W/VALVE (MISCELLANEOUS) ×2 IMPLANT
CHLORAPREP W/TINT 26 (MISCELLANEOUS) ×4 IMPLANT
COVER WAND RF STERILE (DRAPES) ×2 IMPLANT
DRAPE C-ARM XRAY 36X54 (DRAPES) ×2 IMPLANT
DRAPE IMP U-DRAPE 54X76 (DRAPES) ×4 IMPLANT
DRAPE INCISE IOBAN 66X45 STRL (DRAPES) ×4 IMPLANT
DRSG OPSITE POSTOP 4X6 (GAUZE/BANDAGES/DRESSINGS) ×2 IMPLANT
GAUZE SPONGE 4X4 12PLY STRL (GAUZE/BANDAGES/DRESSINGS) ×2 IMPLANT
GLOVE BIO SURGEON STRL SZ7.5 (GLOVE) ×4 IMPLANT
GLOVE BIO SURGEON STRL SZ8 (GLOVE) ×4 IMPLANT
GLOVE BIOGEL PI IND STRL 8 (GLOVE) ×2 IMPLANT
GLOVE BIOGEL PI INDICATOR 8 (GLOVE) ×2
GLOVE INDICATOR 8.0 STRL GRN (GLOVE) ×2 IMPLANT
GLOVE SURG XRAY 8.0 LX (GLOVE) ×2 IMPLANT
GOWN STRL REUS W/ TWL LRG LVL3 (GOWN DISPOSABLE) ×1 IMPLANT
GOWN STRL REUS W/ TWL XL LVL3 (GOWN DISPOSABLE) ×1 IMPLANT
GOWN STRL REUS W/TWL LRG LVL3 (GOWN DISPOSABLE) ×1
GOWN STRL REUS W/TWL XL LVL3 (GOWN DISPOSABLE) ×1
IMMOBILIZER SHDR LG LX 900803 (SOFTGOODS) ×2 IMPLANT
KIT STABILIZATION SHOULDER (MISCELLANEOUS) ×2 IMPLANT
KIT TURNOVER KIT A (KITS) ×2 IMPLANT
MASK FACE SPIDER DISP (MASK) ×2 IMPLANT
MAT ABSORB  FLUID 56X50 GRAY (MISCELLANEOUS) ×1
MAT ABSORB FLUID 56X50 GRAY (MISCELLANEOUS) ×1 IMPLANT
NDL FILTER BLUNT 18X1 1/2 (NEEDLE) ×1 IMPLANT
NEEDLE FILTER BLUNT 18X 1/2SAF (NEEDLE) ×1
NEEDLE FILTER BLUNT 18X1 1/2 (NEEDLE) ×1 IMPLANT
NS IRRIG 500ML POUR BTL (IV SOLUTION) ×2 IMPLANT
PACK ARTHROSCOPY SHOULDER (MISCELLANEOUS) ×2 IMPLANT
PLATE CLAV LOCK 6H SML (Plate) ×1 IMPLANT
PUTTY DBX 1CC (Putty) ×2 IMPLANT
PUTTY DBX 1CC DEPUY (Putty) IMPLANT
SCREW NON LOCK 3.5X10MM (Screw) ×2 IMPLANT
SCREW NON LOCK 3.5X8MM (Screw) ×3 IMPLANT
SCREW NONLOCK HEX 3.5X12 (Screw) ×1 IMPLANT
STAPLER SKIN PROX 35W (STAPLE) ×2 IMPLANT
STRIP CLOSURE SKIN 1/2X4 (GAUZE/BANDAGES/DRESSINGS) ×2 IMPLANT
SUT VIC AB 2-0 CT1 27 (SUTURE) ×2
SUT VIC AB 2-0 CT1 TAPERPNT 27 (SUTURE) ×2 IMPLANT
SUT VIC AB 2-0 CT2 27 (SUTURE) ×4 IMPLANT
SYR 10ML LL (SYRINGE) ×2 IMPLANT

## 2018-12-05 NOTE — Discharge Instructions (Addendum)
AMBULATORY SURGERY  DISCHARGE INSTRUCTIONS   1) The drugs that you were given will stay in your system until tomorrow so for the next 24 hours you should not:  A) Drive an automobile B) Make any legal decisions C) Drink any alcoholic beverage   2) You may resume regular meals tomorrow.  Today it is better to start with liquids and gradually work up to solid foods.  You may eat anything you prefer, but it is better to start with liquids, then soup and crackers, and gradually work up to solid foods.   3) Please notify your doctor immediately if you have any unusual bleeding, trouble breathing, redness and pain at the surgery site, drainage, fever, or pain not relieved by medication. 4)   5) Your post-operative visit with Dr.                                     is: Date:                        Time:    Please call to schedule your post-operative visit.  6) Additional Instructions:    Orthopedic discharge instructions: May shower with intact OpSite dressing.  Apply ice frequently to shoulder. Take ibuprofen 600 mg TID with meals for 7-10 days, then as necessary. Take oxycodone as prescribed when needed.  May supplement with ES Tylenol if necessary. Keep shoulder sling on at all times except may remove for bathing purposes. Follow-up in 10-14 days or as scheduled.

## 2018-12-05 NOTE — Op Note (Signed)
12/05/2018  3:37 PM  Patient:   Brenda Cohen  Pre-Op Diagnosis:   Displaced midshaft left clavicle fracture.  Post-Op Diagnosis:   Same.  Procedure:   Open reduction and internal fixation of displaced midshaft left clavicle fracture.  Surgeon:   Pascal Lux, MD  Assistant:   Cameron Proud, PA-C  Anesthesia:   GET  Findings:   As above.  Complications:   None  EBL:   10 cc  Fluids:   900 cc crystalloid  TT:   None  Drains:   None  Closure:   3-0 Vicryl subcuticular sutures.  Implants:   Acumed 6-hole precontoured left clavicular plate.  Brief Clinical Note:   The patient is a 33 year old female who sustained the above-noted injury 5 days ago when she apparently tripped over a dog while going through a gate into her friend's pool area. She presented to the local emergency room where x-rays demonstrated the above-noted injury. The patient presents at this time for definitive management of this injury.  Procedure:   The patient was brought into the operating room and lain in the supine position. After adequate general endotracheal intubation and anesthesia were obtained, the patient was repositioned in the beach chair position using the beach chair positioner. The left shoulder and upper extremity were prepped with ChloraPrep solution before being draped sterilely. Preoperative antibiotics were administered. After performing a timeout to verify the appropriate surgical site, an approximately 8-10 cm longitudinally-oriented incision was made over the clavicle, centered over the fracture. The incision was carried down through the subcutaneous tissues to expose the platysma. This was split the length of the incision and the underlying clavicle identified. The clavipectoral fascia was divided over the fracture and subperiosteal dissection carried out sufficiently to expose the fracture. Fracture hematoma was removed using pickups and a small dental pick.   The fracture was  reduced and temporarily secured using a bone holding clamp. A 6-hole plate was selected and applied over the fracture. This appeared to fit quite well, especially after some gentle bending of the plate, enabling six cortical fixation sites both proximal and distal to the fracture. The plate was applied over the fracture and temporarily held in place with a bone-holding clamp which also maintained fracture reduction. One bicortical screw was placed in the proximal fragment before a second bicortical screw was placed distally in the slotted hole so that it provided compression across the fracture. Four additional bicortical screws were placed to complete fracture fixation. The adequacy of fracture reduction and hardware position was verified fluoroscopically in AP and lateral projections and found to be excellent.   The wound was copiously irrigated with sterile saline solution before 1 cc of demineralized bone matrix putty was injected around the fracture site in order to try to optimize healing. The clavipectoral fascia was reapproximated using 2-0 Vicryl interrupted sutures. The platysma also was closed using 2-0 Vicryl interrupted sutures before the skin was closed using 3-0 Vicryl inverted subcuticular sutures. Benzoin and Steri-Strips are applied to the skin. A total of 30 cc of 0.25% Sensorcaine with epinephrine was injected in and around the incision to help with postoperative analgesia before a sterile occlusive dressing was applied to the wound. The patient was placed into a shoulder immobilizer before being awakened, extubated, and returned to the recovery room in satisfactory condition after tolerating the procedure well.

## 2018-12-05 NOTE — Transfer of Care (Signed)
Immediate Anesthesia Transfer of Care Note  Patient: Brenda Cohen  Procedure(s) Performed: OPEN REDUCTION INTERNAL FIXATION OF PROXIMAL CLAVICLE FRACTURE (Left )  Patient Location: PACU  Anesthesia Type:General  Level of Consciousness: sedated  Airway & Oxygen Therapy: Patient Spontanous Breathing and Patient connected to face mask oxygen  Post-op Assessment: Report given to RN and Post -op Vital signs reviewed and stable  Post vital signs: Reviewed and stable  Last Vitals:  Vitals Value Taken Time  BP 138/88 12/05/18 1553  Temp    Pulse 107 12/05/18 1554  Resp 14 12/05/18 1554  SpO2 100 % 12/05/18 1554  Vitals shown include unvalidated device data.  Last Pain:  Vitals:   12/05/18 1101  TempSrc: Tympanic  PainSc: 6          Complications: No apparent anesthesia complications

## 2018-12-05 NOTE — Anesthesia Preprocedure Evaluation (Addendum)
Anesthesia Evaluation  Patient identified by MRN, date of birth, ID band Patient awake    Reviewed: Allergy & Precautions, H&P , NPO status , Patient's Chart, lab work & pertinent test results  Airway Mallampati: I  TM Distance: >3 FB    Comment: Tongue piercing Dental  (+) Teeth Intact   Pulmonary Current Smoker,           Cardiovascular negative cardio ROS       Neuro/Psych PSYCHIATRIC DISORDERS Anxiety Depression negative neurological ROS     GI/Hepatic negative GI ROS, (+)     substance abuse  marijuana use,   Endo/Other  negative endocrine ROS  Renal/GU      Musculoskeletal   Abdominal   Peds  Hematology negative hematology ROS (+)   Anesthesia Other Findings Past Medical History: No date: Anxiety No date: Depression  Past Surgical History: 11/08/2017: DILATION AND EVACUATION; N/A     Comment:  Procedure: DILATATION AND EVACUATION;  Surgeon: Gae Dry, MD;  Location: [facility] ORS;  Service: Gynecology;               Laterality: N/A; 11/08/2017: LAPAROSCOPIC BILATERAL SALPINGECTOMY; N/A     Comment:  Procedure: LAPAROSCOPIC BILATERAL SALPINGECTOMY;                Surgeon: Gae Dry, MD;  Location: [facility] ORS;                Service: Gynecology;  Laterality: N/A; 04/19/2015: TUBAL LIGATION; Bilateral     Comment:  Procedure: POST PARTUM TUBAL LIGATION;  Surgeon: Aletha Halim, MD;  Location: [facility] ORS;  Service: Gynecology;                Laterality: Bilateral; No date: WISDOM TOOTH EXTRACTION  BMI    Body Mass Index: 30.24 kg/m      Reproductive/Obstetrics negative OB ROS                            Anesthesia Physical Anesthesia Plan  ASA: II  Anesthesia Plan: General ETT   Post-op Pain Management:    Induction:   PONV Risk Score and Plan: Ondansetron, Dexamethasone, Midazolam and Treatment may vary due to age or medical  condition  Airway Management Planned:   Additional Equipment:   Intra-op Plan:   Post-operative Plan:   Informed Consent: I have reviewed the patients History and Physical, chart, labs and discussed the procedure including the risks, benefits and alternatives for the proposed anesthesia with the patient or authorized representative who has indicated his/her understanding and acceptance.     Dental Advisory Given  Plan Discussed with: Anesthesiologist and CRNA  Anesthesia Plan Comments:         Anesthesia Quick Evaluation

## 2018-12-05 NOTE — Anesthesia Procedure Notes (Signed)
Procedure Name: Intubation Date/Time: 12/05/2018 2:03 PM Performed by: Gentry Fitz, CRNA Pre-anesthesia Checklist: Patient identified, Emergency Drugs available, Suction available and Patient being monitored Patient Re-evaluated:Patient Re-evaluated prior to induction Oxygen Delivery Method: Circle system utilized Preoxygenation: Pre-oxygenation with 100% oxygen Induction Type: IV induction and Cricoid Pressure applied Ventilation: Mask ventilation without difficulty Laryngoscope Size: Mac and 4 Grade View: Grade I Tube type: Oral Tube size: 7.0 mm Number of attempts: 1 Airway Equipment and Method: Stylet Placement Confirmation: ETT inserted through vocal cords under direct vision,  positive ETCO2 and breath sounds checked- equal and bilateral Secured at: 19 cm Tube secured with: Tape Dental Injury: Teeth and Oropharynx as per pre-operative assessment

## 2018-12-05 NOTE — Anesthesia Post-op Follow-up Note (Signed)
Anesthesia QCDR form completed.        

## 2018-12-05 NOTE — Anesthesia Postprocedure Evaluation (Signed)
Anesthesia Post Note  Patient: Air cabin crew  Procedure(s) Performed: OPEN REDUCTION INTERNAL FIXATION OF PROXIMAL CLAVICLE FRACTURE (Left )  Patient location during evaluation: PACU Anesthesia Type: General Level of consciousness: awake and alert Pain management: pain level controlled Vital Signs Assessment: post-procedure vital signs reviewed and stable Respiratory status: spontaneous breathing and respiratory function stable Cardiovascular status: stable Anesthetic complications: no     Last Vitals:  Vitals:   12/05/18 1613 12/05/18 1615  BP: 128/82   Pulse: (!) 105 (!) 104  Resp: 15 16  Temp:    SpO2: 100% 98%    Last Pain:  Vitals:   12/05/18 1615  TempSrc:   PainSc: 5                  KEPHART,WILLIAM K

## 2018-12-05 NOTE — H&P (Signed)
Paper H&P to be scanned into permanent record. H&P reviewed and patient re-examined. No changes. 

## 2018-12-08 ENCOUNTER — Encounter: Payer: Self-pay | Admitting: Surgery

## 2019-05-14 ENCOUNTER — Other Ambulatory Visit: Payer: Self-pay

## 2019-05-14 ENCOUNTER — Emergency Department
Admission: EM | Admit: 2019-05-14 | Discharge: 2019-05-15 | Disposition: A | Discharge status: Home / Self Care | Payer: 59 | Attending: Emergency Medicine | Admitting: Emergency Medicine

## 2019-05-14 DIAGNOSIS — M79606 Pain in leg, unspecified: Secondary | ICD-10-CM

## 2019-05-14 DIAGNOSIS — L03115 Cellulitis of right lower limb: Secondary | ICD-10-CM | POA: Diagnosis not present

## 2019-05-14 DIAGNOSIS — M79604 Pain in right leg: Secondary | ICD-10-CM | POA: Diagnosis present

## 2019-05-14 LAB — CBC
HCT: 38.2 % (ref 36.0–46.0)
Hemoglobin: 12.8 g/dL (ref 12.0–15.0)
MCH: 29.8 pg (ref 26.0–34.0)
MCHC: 33.5 g/dL (ref 30.0–36.0)
MCV: 88.8 fL (ref 80.0–100.0)
Platelets: 322 10*3/uL (ref 150–400)
RBC: 4.3 MIL/uL (ref 3.87–5.11)
RDW: 13.8 % (ref 11.5–15.5)
WBC: 12.3 10*3/uL — ABNORMAL HIGH (ref 4.0–10.5)
nRBC: 0 % (ref 0.0–0.2)

## 2019-05-14 LAB — COMPREHENSIVE METABOLIC PANEL
ALT: 18 U/L (ref 0–44)
AST: 17 U/L (ref 15–41)
Albumin: 4.4 g/dL (ref 3.5–5.0)
Alkaline Phosphatase: 60 U/L (ref 38–126)
Anion gap: 12 (ref 5–15)
BUN: 12 mg/dL (ref 6–20)
CO2: 22 mmol/L (ref 22–32)
Calcium: 8.8 mg/dL — ABNORMAL LOW (ref 8.9–10.3)
Chloride: 100 mmol/L (ref 98–111)
Creatinine, Ser: 0.82 mg/dL (ref 0.44–1.00)
GFR calc Af Amer: >60 mL/min (ref >60)
GFR calc non Af Amer: >60 mL/min (ref >60)
Glucose, Bld: 87 mg/dL (ref 70–99)
Potassium: 3.6 mmol/L (ref 3.5–5.1)
Sodium: 134 mmol/L — ABNORMAL LOW (ref 135–145)
Total Bilirubin: 1.2 mg/dL (ref 0.3–1.2)
Total Protein: 7.7 g/dL (ref 6.5–8.1)

## 2019-05-14 LAB — LACTIC ACID, PLASMA: Lactic Acid, Venous: 1 mmol/L (ref 0.5–1.9)

## 2019-05-14 MED ORDER — CLINDAMYCIN PHOSPHATE 600 MG/50ML IV SOLN
600.0000 mg | Freq: Once | INTRAVENOUS | Status: AC
  Administered 2019-05-15: 01:00:00 600 mg via INTRAVENOUS
  Filled 2019-05-14: qty 50

## 2019-05-14 MED ORDER — SODIUM CHLORIDE 0.9 % IV SOLN
1.0000 g | Freq: Once | INTRAVENOUS | Status: AC
  Administered 2019-05-15: 1 g via INTRAVENOUS
  Filled 2019-05-14: qty 10

## 2019-05-14 NOTE — ED Triage Notes (Signed)
Pt injected crystal meth into vein in anterior right lower leg and now has red and swelling to area. Pt denies any  Hx of the same. States redness and swelling Is now spreading to right foot and ankle.

## 2019-05-15 ENCOUNTER — Emergency Department: Payer: 59

## 2019-05-15 MED ORDER — CLINDAMYCIN HCL 300 MG PO CAPS: 300.0000 mg | ORAL_CAPSULE | Freq: Three times a day (TID) | ORAL | 0 refills | Status: AC

## 2019-05-15 MED ORDER — SODIUM CHLORIDE 0.9 % IV BOLUS
1000.0000 mL | Freq: Once | INTRAVENOUS | Status: AC
  Administered 2019-05-15: 1000 mL via INTRAVENOUS
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
## 2019-05-15 NOTE — ED Notes (Signed)
Patient discharged to home per MD order. Patient in stable condition, and deemed medically cleared by ED provider for discharge. Discharge instructions reviewed with patient/family using "Teach Back"; verbalized understanding of medication education and administration, and information about follow-up care. Denies further concerns. ° °

## 2019-05-15 NOTE — Discharge Instructions (Signed)

## 2019-06-01 ENCOUNTER — Ambulatory Visit
Admission: EM | Admit: 2019-06-01 | Discharge: 2019-06-01 | Disposition: A | Discharge status: Home / Self Care | Payer: No Typology Code available for payment source | Attending: Emergency Medicine | Admitting: Emergency Medicine

## 2019-06-01 ENCOUNTER — Other Ambulatory Visit: Payer: Self-pay

## 2019-06-01 ENCOUNTER — Emergency Department
Admission: EM | Admit: 2019-06-01 | Discharge: 2019-06-02 | Disposition: A | Discharge status: Home / Self Care | Payer: 59 | Attending: Emergency Medicine | Admitting: Emergency Medicine

## 2019-06-01 ENCOUNTER — Encounter: Payer: Self-pay | Admitting: Emergency Medicine

## 2019-06-01 DIAGNOSIS — F1210 Cannabis abuse, uncomplicated: Secondary | ICD-10-CM | POA: Insufficient documentation

## 2019-06-01 DIAGNOSIS — Z0441 Encounter for examination and observation following alleged adult rape: Secondary | ICD-10-CM | POA: Diagnosis present

## 2019-06-01 DIAGNOSIS — R519 Headache, unspecified: Secondary | ICD-10-CM | POA: Diagnosis not present

## 2019-06-01 DIAGNOSIS — T7421XA Adult sexual abuse, confirmed, initial encounter: Secondary | ICD-10-CM | POA: Diagnosis not present

## 2019-06-01 LAB — COMPREHENSIVE METABOLIC PANEL
ALT: 15 U/L (ref 0–44)
AST: 21 U/L (ref 15–41)
Albumin: 3.6 g/dL (ref 3.5–5.0)
Alkaline Phosphatase: 46 U/L (ref 38–126)
Anion gap: 7 (ref 5–15)
BUN: 14 mg/dL (ref 6–20)
CO2: 25 mmol/L (ref 22–32)
Calcium: 8.8 mg/dL — ABNORMAL LOW (ref 8.9–10.3)
Chloride: 106 mmol/L (ref 98–111)
Creatinine, Ser: 0.57 mg/dL (ref 0.44–1.00)
GFR calc Af Amer: >60 mL/min (ref >60)
GFR calc non Af Amer: >60 mL/min (ref >60)
Glucose, Bld: 93 mg/dL (ref 70–99)
Potassium: 3.9 mmol/L (ref 3.5–5.1)
Sodium: 138 mmol/L (ref 135–145)
Total Bilirubin: 0.4 mg/dL (ref 0.3–1.2)
Total Protein: 6.6 g/dL (ref 6.5–8.1)

## 2019-06-01 LAB — RAPID HIV SCREEN (HIV 1/2 AB+AG)
HIV 1/2 Antibodies: NONREACTIVE
HIV-1 P24 Antigen - HIV24: NONREACTIVE

## 2019-06-01 MED ORDER — LIDOCAINE HCL (PF) 1 % IJ SOLN
0.9000 mL | Freq: Once | INTRAMUSCULAR | Status: AC
  Administered 2019-06-02: 01:00:00 0.9 mL
  Filled 2019-06-01: qty 5

## 2019-06-01 MED ORDER — ELVITEG-COBIC-EMTRICIT-TENOFAF 150-150-200-10 MG PREPACK
5.0000 | ORAL_TABLET | Freq: Once | ORAL | Status: AC
  Administered 2019-06-01: 23:00:00 1 via ORAL
  Filled 2019-06-01: qty 1

## 2019-06-01 MED ORDER — ELVITEG-COBIC-EMTRICIT-TENOFAF 150-150-200-10 MG PO TABS: 1.0000 | ORAL_TABLET | Freq: Every day | ORAL | 0 refills | Status: DC

## 2019-06-01 MED ORDER — CEFTRIAXONE SODIUM 250 MG IJ SOLR
250.0000 mg | Freq: Once | INTRAMUSCULAR | Status: AC
  Administered 2019-06-02: 01:00:00 250 mg via INTRAMUSCULAR
  Filled 2019-06-01: qty 250

## 2019-06-01 MED ORDER — METRONIDAZOLE 500 MG PO TABS
2000.0000 mg | ORAL_TABLET | Freq: Once | ORAL | Status: AC
  Administered 2019-06-02: 2000 mg via ORAL
  Filled 2019-06-01: qty 4

## 2019-06-01 MED ORDER — AZITHROMYCIN 500 MG PO TABS
1000.0000 mg | ORAL_TABLET | Freq: Once | ORAL | Status: AC
  Administered 2019-06-02: 01:00:00 1000 mg via ORAL
  Filled 2019-06-01: qty 2

## 2019-06-01 MED ORDER — ACETAMINOPHEN 325 MG PO TABS
650.0000 mg | ORAL_TABLET | Freq: Once | ORAL | Status: AC
  Administered 2019-06-01: 650 mg via ORAL
  Filled 2019-06-01: qty 2

## 2019-06-01 NOTE — ED Notes (Signed)
ED Provider Jenise B at bedside.
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
## 2019-06-01 NOTE — ED Notes (Signed)
Sane nurse at bedside at this time.

## 2019-06-01 NOTE — ED Notes (Signed)
Sane nurse returned call.  Please call once patient has been placed in a room.  [phone]
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
## 2019-06-01 NOTE — ED Triage Notes (Signed)
SANE nurse called.  Message left.

## 2019-06-02 LAB — HEPATITIS B SURFACE ANTIGEN: Hepatitis B Surface Ag: NONREACTIVE

## 2019-06-02 LAB — HEPATITIS C ANTIBODY: HCV Ab: NONREACTIVE

## 2019-06-02 LAB — POCT PREGNANCY, URINE: Preg Test, Ur: NEGATIVE

## 2019-06-02 MED FILL — GENVOYA TABLET: 150-150-200 | 30 days supply | Qty: 30 | Fill #0

## 2019-06-02 NOTE — SANE Note (Signed)
iAssist portal is down.   The patient was given her 1 Genvoya on site and 4 more tablets to take over the next 4 days. Her prescription and paperwork have been faxed to [facility]   The day SANE RN will be made aware of the iAssist status during morning report.

## 2019-06-02 NOTE — SANE Note (Signed)
Follow-up Phone Call  Patient gives verbal consent for a FNE/SANE follow-up phone call in 48-72 hours: NO Patient's telephone number: [phone] Patient gives verbal consent to leave voicemail at the phone number listed above: No DO NOT CALL between the hours of: Does not request a follow-up call
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
## 2019-06-02 NOTE — SANE Note (Signed)
Patient urine pregnancy test was negative.   Lot: [id number] Exp 2020-10-02  Manually entered into meter.

## 2019-06-02 NOTE — SANE Note (Signed)
The SANE/FNE (Forensic Nurse Examiner) consult has been completed. The primary or charge RN and physician have been notified. Please contact the SANE/FNE nurse on call (listed in Amion) with any further concerns.  

## 2019-06-03 LAB — RPR: RPR Ser Ql: NONREACTIVE

## 2020-03-28 ENCOUNTER — Emergency Department
Admission: EM | Admit: 2020-03-28 | Discharge: 2020-03-29 | Disposition: A | Discharge status: Home / Self Care | Payer: 59 | Attending: Emergency Medicine | Admitting: Emergency Medicine

## 2020-03-28 ENCOUNTER — Other Ambulatory Visit: Payer: Self-pay

## 2020-03-28 DIAGNOSIS — F329 Major depressive disorder, single episode, unspecified: Secondary | ICD-10-CM | POA: Insufficient documentation

## 2020-03-28 DIAGNOSIS — F1994 Other psychoactive substance use, unspecified with psychoactive substance-induced mood disorder: Secondary | ICD-10-CM

## 2020-03-28 DIAGNOSIS — F1514 Other stimulant abuse with stimulant-induced mood disorder: Secondary | ICD-10-CM | POA: Insufficient documentation

## 2020-03-28 DIAGNOSIS — Z20822 Contact with and (suspected) exposure to covid-19: Secondary | ICD-10-CM | POA: Insufficient documentation

## 2020-03-28 DIAGNOSIS — F1510 Other stimulant abuse, uncomplicated: Secondary | ICD-10-CM

## 2020-03-28 DIAGNOSIS — R45851 Suicidal ideations: Secondary | ICD-10-CM | POA: Insufficient documentation

## 2020-03-28 DIAGNOSIS — F330 Major depressive disorder, recurrent, mild: Secondary | ICD-10-CM | POA: Diagnosis present

## 2020-03-28 LAB — ETHANOL: Alcohol, Ethyl (B): <10 mg/dL (ref <10)

## 2020-03-28 LAB — CBC
HCT: 37.7 % (ref 36.0–46.0)
Hemoglobin: 12.7 g/dL (ref 12.0–15.0)
MCH: 30.6 pg (ref 26.0–34.0)
MCHC: 33.7 g/dL (ref 30.0–36.0)
MCV: 90.8 fL (ref 80.0–100.0)
Platelets: 290 10*3/uL (ref 150–400)
RBC: 4.15 MIL/uL (ref 3.87–5.11)
RDW: 13.9 % (ref 11.5–15.5)
WBC: 10.6 10*3/uL — ABNORMAL HIGH (ref 4.0–10.5)
nRBC: 0 % (ref 0.0–0.2)

## 2020-03-28 LAB — URINE DRUG SCREEN, QUALITATIVE (ARMC ONLY)
Amphetamines, Ur Screen: POSITIVE — AB
Barbiturates, Ur Screen: NOT DETECTED
Benzodiazepine, Ur Scrn: NOT DETECTED
Cannabinoid 50 Ng, Ur ~~LOC~~: NOT DETECTED
Cocaine Metabolite,Ur ~~LOC~~: NOT DETECTED
MDMA (Ecstasy)Ur Screen: NOT DETECTED
Methadone Scn, Ur: NOT DETECTED
Opiate, Ur Screen: NOT DETECTED
Phencyclidine (PCP) Ur S: NOT DETECTED
Tricyclic, Ur Screen: NOT DETECTED

## 2020-03-28 LAB — SALICYLATE LEVEL: Salicylate Lvl: <7 mg/dL — ABNORMAL LOW (ref 7.0–30.0)

## 2020-03-28 LAB — COMPREHENSIVE METABOLIC PANEL
ALT: 26 U/L (ref 0–44)
AST: 48 U/L — ABNORMAL HIGH (ref 15–41)
Albumin: 4.1 g/dL (ref 3.5–5.0)
Alkaline Phosphatase: 51 U/L (ref 38–126)
Anion gap: 8 (ref 5–15)
BUN: 25 mg/dL — ABNORMAL HIGH (ref 6–20)
CO2: 25 mmol/L (ref 22–32)
Calcium: 8.6 mg/dL — ABNORMAL LOW (ref 8.9–10.3)
Chloride: 100 mmol/L (ref 98–111)
Creatinine, Ser: 0.9 mg/dL (ref 0.44–1.00)
GFR, Estimated: >60 mL/min (ref >60)
Glucose, Bld: 178 mg/dL — ABNORMAL HIGH (ref 70–99)
Potassium: 3.7 mmol/L (ref 3.5–5.1)
Sodium: 133 mmol/L — ABNORMAL LOW (ref 135–145)
Total Bilirubin: 1.8 mg/dL — ABNORMAL HIGH (ref 0.3–1.2)
Total Protein: 6.7 g/dL (ref 6.5–8.1)

## 2020-03-28 LAB — RESPIRATORY PANEL BY RT PCR (FLU A&B, COVID)
Influenza A by PCR: NEGATIVE
Influenza B by PCR: NEGATIVE
SARS Coronavirus 2 by RT PCR: NEGATIVE

## 2020-03-28 LAB — POCT PREGNANCY, URINE: Preg Test, Ur: NEGATIVE

## 2020-03-28 LAB — ACETAMINOPHEN LEVEL: Acetaminophen (Tylenol), Serum: <10 ug/mL — ABNORMAL LOW (ref 10–30)

## 2020-03-28 NOTE — ED Notes (Signed)
Pt dresses out by this RN and Shanda Bumps EDT. Pt belongings placed in bad with pt label on it. belonging included.  Red Nikes, grey long sleeve T-shirt. Green undershirt, black legging. Pt has two piercing's, one in each ear that she states can not be removed.
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
## 2020-03-28 NOTE — Consult Note (Addendum)
[facility] Face-to-Face Psychiatry Consult   Reason for Consult:  Psych evaluation  Referring Physician:  Dr. Katrinka Blazing  Patient Identification: Brenda Cohen MRN:  [id number] Principal Diagnosis: <principal problem not specified> Diagnosis:  Active Problems:   Substance induced mood disorder (HCC)   Total Time spent with patient: 45 minutes  Subjective:   Brenda Cohen is a 34 y.o. female patient admitted to [facility] voluntarily.  Per triage nurse, pt arrives via pov for SI, and drug use. Pt reports using methamphetamines, last use was yesterday. Pt report SI on way into hospital, states "easier for them to have a dead mom, than a mom they see", pt referring to kids. Pt states currently dealing with DSS and custody issues. Pt denies and HI. Pt states she does not currently have an SI plan.   HPI:  Brenda Cohen, 34 y.o., female patient seen face to face by this provider; and chart reviewed.  On evaluation Brenda Cohen reports that used "meth" today.  She says she feels bad about it.  At this point she says that she would like to go to drug treatment from here. She has three children of whom she doesn't have custody of due to drug use.  She says each child lives their respective fathers.  They have been living with their fathers since DSS took them from the home.  Since her drug use, she says she has been homeless and living in the streets. She says she often thinks of suicide when she is "high" but denies suicidal thoughts now.  She denies previous suicidal attempts and prior psychiatric hospitalizations.    During evaluation Brenda Cohen is laying in the bed sleepin; she is easily awaken when her named is called.  She is confused and somewhat disoriented;  She is calm/cooperative; and mood congruent with affect.  Patient is speaking in a mumbled and low tone at moderate volume, with poor eye contact. There is no indication that she is currently responding to internal/external stimuli  or experiencing delusional thought content.  Patient denies suicidal/self-harm/homicidal ideation, psychosis, and paranoia.    Recommendation: Patient is requesting drug treatment facility.  Overnight observation and assistance with substance abuse treatment placement.   Past Psychiatric History:  Depression and substance use disorder  Risk to Self:  yes Risk to Others:  no Prior Inpatient Therapy:   Prior Outpatient Therapy:    Past Medical History:  Past Medical History:  Diagnosis Date  . Anxiety   . Depression     Past Surgical History:  Procedure Laterality Date  . DILATION AND EVACUATION N/A 11/08/2017   Procedure: DILATATION AND EVACUATION;  Surgeon: Nadara Mustard, MD;  Location: [facility] ORS;  Service: Gynecology;  Laterality: N/A;  . LAPAROSCOPIC BILATERAL SALPINGECTOMY N/A 11/08/2017   Procedure: LAPAROSCOPIC BILATERAL SALPINGECTOMY;  Surgeon: Nadara Mustard, MD;  Location: [facility] ORS;  Service: Gynecology;  Laterality: N/A;  . ORIF CLAVICULAR FRACTURE Left 12/05/2018   Procedure: OPEN REDUCTION INTERNAL FIXATION OF PROXIMAL CLAVICLE FRACTURE;  Surgeon: Christena Flake, MD;  Location: [facility] ORS;  Service: Orthopedics;  Laterality: Left;  . TUBAL LIGATION Bilateral 04/19/2015   Procedure: POST PARTUM TUBAL LIGATION;  Surgeon:  Bing, MD;  Location: [facility] ORS;  Service: Gynecology;  Laterality: Bilateral;  . WISDOM TOOTH EXTRACTION     Family History:  Family History  Problem Relation Age of Onset  . Drug abuse Mother   . Hyperlipidemia Father   . Hypertension Father   . Depression Father   .  Migraines Sister   . Bipolar disorder Paternal Uncle   . Schizophrenia Paternal Uncle   . OCD Paternal Uncle    Family Psychiatric  History: unknown Social History:  Social History   Substance and Sexual Activity  Alcohol Use Yes  . Alcohol/week: 1.0 standard drink  . Types: 1 Glasses of wine per week   Comment: OCCAS     Social History   Substance and Sexual Activity   Drug Use Yes  . Types: Marijuana, Methamphetamines   Comment: not since she pregnant    Social History   Socioeconomic History  . Marital status: Single    Spouse name: Not on file  . Number of children: Not on file  . Years of education: Not on file  . Highest education level: Not on file  Occupational History  . Not on file  Tobacco Use  . Smoking status: Never Smoker  . Smokeless tobacco: Never Used  Vaping Use  . Vaping Use: Never used  Substance and Sexual Activity  . Alcohol use: Yes    Alcohol/week: 1.0 standard drink    Types: 1 Glasses of wine per week    Comment: OCCAS  . Drug use: Yes    Types: Marijuana, Methamphetamines    Comment: not since she pregnant  . Sexual activity: Never    Birth control/protection: Surgical  Other Topics Concern  . Not on file  Social History Narrative  . Not on file   Social Determinants of Health   Financial Resource Strain:   . Difficulty of Paying Living Expenses: Not on file  Food Insecurity:   . Worried About Programme researcher, broadcasting/film/video in the Last Year: Not on file  . Ran Out of Food in the Last Year: Not on file  Transportation Needs:   . Lack of Transportation (Medical): Not on file  . Lack of Transportation (Non-Medical): Not on file  Physical Activity:   . Days of Exercise per Week: Not on file  . Minutes of Exercise per Session: Not on file  Stress:   . Feeling of Stress : Not on file  Social Connections:   . Frequency of Communication with Friends and Family: Not on file  . Frequency of Social Gatherings with Friends and Family: Not on file  . Attends Religious Services: Not on file  . Active Member of Clubs or Organizations: Not on file  . Attends Banker Meetings: Not on file  . Marital Status: Not on file   Additional Social History:    Allergies:   Allergies  Allergen Reactions  . Bactrim [Sulfamethoxazole-Trimethoprim] Hives    Labs:  Results for orders placed or performed during the  hospital encounter of 03/28/20 (from the past 48 hour(s))  Comprehensive metabolic panel     Status: Abnormal   Collection Time: 03/28/20  6:12 PM  Result Value Ref Range   Sodium 133 (L) 135 - 145 mmol/L   Potassium 3.7 3.5 - 5.1 mmol/L   Chloride 100 98 - 111 mmol/L   CO2 25 22 - 32 mmol/L   Glucose, Bld 178 (H) 70 - 99 mg/dL    Comment: Glucose reference range applies only to samples taken after fasting for at least 8 hours.   BUN 25 (H) 6 - 20 mg/dL   Creatinine, Ser 9.62 0.44 - 1.00 mg/dL   Calcium 8.6 (L) 8.9 - 10.3 mg/dL   Total Protein 6.7 6.5 - 8.1 g/dL   Albumin 4.1 3.5 - 5.0 g/dL  AST 48 (H) 15 - 41 U/L   ALT 26 0 - 44 U/L   Alkaline Phosphatase 51 38 - 126 U/L   Total Bilirubin 1.8 (H) 0.3 - 1.2 mg/dL   GFR, Estimated >27 >03 mL/min    Comment: (NOTE) Calculated using the CKD-EPI Creatinine Equation (2021)    Anion gap 8 5 - 15    Comment: Performed at [facility], [address] [place], [place] [address]  Ethanol     Status: None   Collection Time: 03/28/20  6:12 PM  Result Value Ref Range   Alcohol, Ethyl (B) <10 <10 mg/dL    Comment: (NOTE) Lowest detectable limit for serum alcohol is 10 mg/dL.  For medical purposes only. Performed at [facility], [address] [place], [place] [address]   Salicylate level     Status: Abnormal   Collection Time: 03/28/20  6:12 PM  Result Value Ref Range   Salicylate Lvl <7.0 (L) 7.0 - 30.0 mg/dL    Comment: Performed at [facility], [address] [place], [place] [address]  Acetaminophen level     Status: Abnormal   Collection Time: 03/28/20  6:12 PM  Result Value Ref Range   Acetaminophen (Tylenol), Serum <10 (L) 10 - 30 ug/mL    Comment: (NOTE) Therapeutic concentrations vary significantly. A range of 10-30 ug/mL  may be an effective concentration for many patients. However, some  are best treated at concentrations outside of this range. Acetaminophen concentrations >150 ug/mL  at 4 hours after ingestion  and >50 ug/mL at 12 hours after ingestion are often associated with  toxic reactions.  Performed at [facility], [address] [place], [place] [address]   cbc     Status: Abnormal   Collection Time: 03/28/20  6:12 PM  Result Value Ref Range   WBC 10.6 (H) 4.0 - 10.5 K/uL   RBC 4.15 3.87 - 5.11 MIL/uL   Hemoglobin 12.7 12.0 - 15.0 g/dL   HCT 38.1 36 - 46 %   MCV 90.8 80.0 - 100.0 fL   MCH 30.6 26.0 - 34.0 pg   MCHC 33.7 30.0 - 36.0 g/dL   RDW 01.7 51.0 - 25.8 %   Platelets 290 150 - 400 K/uL   nRBC 0.0 0.0 - 0.2 %    Comment: Performed at [facility], [address], [place], [place] [address]  Urine Drug Screen, Qualitative     Status: Abnormal   Collection Time: 03/28/20  6:17 PM  Result Value Ref Range   Tricyclic, Ur Screen NONE DETECTED NONE DETECTED   Amphetamines, Ur Screen POSITIVE (A) NONE DETECTED   MDMA (Ecstasy)Ur Screen NONE DETECTED NONE DETECTED   Cocaine Metabolite,Ur [place] NONE DETECTED NONE DETECTED   Opiate, Ur Screen NONE DETECTED NONE DETECTED   Phencyclidine (PCP) Ur S NONE DETECTED NONE DETECTED   Cannabinoid 50 Ng, Ur [place] NONE DETECTED NONE DETECTED   Barbiturates, Ur Screen NONE DETECTED NONE DETECTED   Benzodiazepine, Ur Scrn NONE DETECTED NONE DETECTED   Methadone Scn, Ur NONE DETECTED NONE DETECTED    Comment: (NOTE) Tricyclics + metabolites, urine    Cutoff 1000 ng/mL Amphetamines + metabolites, urine  Cutoff 1000 ng/mL MDMA (Ecstasy), urine              Cutoff 500 ng/mL Cocaine Metabolite, urine          Cutoff 300 ng/mL Opiate + metabolites, urine        Cutoff 300  ng/mL Phencyclidine (PCP), urine         Cutoff 25 ng/mL Cannabinoid, urine                 Cutoff 50 ng/mL Barbiturates + metabolites, urine  Cutoff 200 ng/mL Benzodiazepine, urine              Cutoff 200 ng/mL Methadone, urine                   Cutoff 300 ng/mL  The urine drug screen provides only a preliminary,  unconfirmed analytical test result and should not be used for non-medical purposes. Clinical consideration and professional judgment should be applied to any positive drug screen result due to possible interfering substances. A more specific alternate chemical method must be used in order to obtain a confirmed analytical result. Gas chromatography / mass spectrometry (GC/MS) is the preferred confirm atory method. Performed at [facility], [address] [place], [place] [address]   Pregnancy, urine POC     Status: None   Collection Time: 03/28/20  6:22 PM  Result Value Ref Range   Preg Test, Ur NEGATIVE NEGATIVE    Comment:        THE SENSITIVITY OF THIS METHODOLOGY IS >24 mIU/mL     No current facility-administered medications for this encounter.   No current outpatient medications on file.    Musculoskeletal: Strength & Muscle Tone: within normal limits Gait & Station: unsteady Patient leans: N/A  Psychiatric Specialty Exam: Physical Exam Vitals and nursing note reviewed.  Constitutional:      Appearance: She is toxic-appearing.  HENT:     Head: Normocephalic and atraumatic.     Nose: Nose normal.  Pulmonary:     Effort: Pulmonary effort is normal.  Musculoskeletal:        General: Normal range of motion.     Cervical back: Normal range of motion.  Skin:    General: Skin is dry.  Neurological:     Mental Status: She is disoriented.  Psychiatric:        Attention and Perception: She is inattentive.        Mood and Affect: Affect is labile and inappropriate.        Speech: Speech is delayed and slurred.        Behavior: Behavior is slowed and withdrawn. Behavior is cooperative.        Cognition and Memory: Cognition is impaired. She exhibits impaired recent memory.        Judgment: Judgment is inappropriate.     Review of Systems  Psychiatric/Behavioral: Positive for confusion, dysphoric mood and suicidal ideas. The patient is nervous/anxious.    All other systems reviewed and are negative.   Blood pressure (!) 118/92, pulse 96, temperature 98.3 F (36.8 C), temperature source Oral, resp. rate 18, height 5\' 1"  (1.549 m), weight 54.4 kg, last menstrual period 03/27/2020, SpO2 100 %, currently breastfeeding.Body mass index is 22.67 kg/m.  General Appearance: Disheveled  Eye Contact:  Minimal  Speech:  Garbled  Volume:  Decreased  Mood:  Dysphoric  Affect:  Inappropriate and Restricted  Thought Process:  Disorganized and Descriptions of Associations: Loose  Orientation:  Full (Time, Place, and Person)  Thought Content:  WDL  Suicidal Thoughts:  No  Homicidal Thoughts:  No  Memory:  Recent;   Fair  Judgement:  Impaired  Insight:  Lacking  Psychomotor Activity:  Decreased  Concentration:  Attention Span: Fair  Recall:  03/29/2020  of Knowledge:  Fair  Language:  Fair  Akathisia:  NA  Handed:  Right  AIMS (if indicated):     Assets:  Desire for Improvement  ADL's:  Intact  Cognition:  Impaired,  Mild  Sleep:      Disposition: No evidence of imminent risk to self or others at present.   Refer to IOP. Discussed crisis plan, support from social network, calling 911, coming to the Emergency Department, and calling Suicide Hotline. Referral to outpatient treatment  Jearld LeschRashaun M Areesha Dehaven, NP 03/28/2020 9:54 PM
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
## 2020-03-28 NOTE — ED Notes (Signed)
Multiple superficial scratched noted all over pt legs when being dresses out.

## 2020-03-28 NOTE — ED Triage Notes (Signed)
Pt arrives via pov for SI, and drug use. Pt reports using methamphetamines, last use was yesterday. Pt report SI on way into hospital, states "easier for them to have a dead mom, than a mom they see", pt referring to kids. Pt states currently dealing with DSS and custody issues. Pt denies and HI. Pt states she does not currently have an SI plan.

## 2020-03-29 ENCOUNTER — Encounter: Payer: Self-pay | Admitting: Psychiatry

## 2020-03-29 ENCOUNTER — Inpatient Hospital Stay
Admission: RE | Admit: 2020-03-29 | Discharge: 2020-04-07 | DRG: 885 | Disposition: A | Discharge status: Home / Self Care | Payer: No Typology Code available for payment source | Source: Intra-hospital | Attending: Behavioral Health | Admitting: Behavioral Health

## 2020-03-29 DIAGNOSIS — Z8249 Family history of ischemic heart disease and other diseases of the circulatory system: Secondary | ICD-10-CM | POA: Diagnosis not present

## 2020-03-29 DIAGNOSIS — F15950 Other stimulant use, unspecified with stimulant-induced psychotic disorder with delusions: Secondary | ICD-10-CM | POA: Diagnosis present

## 2020-03-29 DIAGNOSIS — F330 Major depressive disorder, recurrent, mild: Secondary | ICD-10-CM | POA: Diagnosis present

## 2020-03-29 DIAGNOSIS — G4700 Insomnia, unspecified: Secondary | ICD-10-CM | POA: Diagnosis present

## 2020-03-29 DIAGNOSIS — Z5900 Homelessness unspecified: Secondary | ICD-10-CM

## 2020-03-29 DIAGNOSIS — Z83438 Family history of other disorder of lipoprotein metabolism and other lipidemia: Secondary | ICD-10-CM | POA: Diagnosis not present

## 2020-03-29 DIAGNOSIS — R45851 Suicidal ideations: Secondary | ICD-10-CM | POA: Diagnosis present

## 2020-03-29 DIAGNOSIS — F15150 Other stimulant abuse with stimulant-induced psychotic disorder with delusions: Secondary | ICD-10-CM | POA: Diagnosis present

## 2020-03-29 DIAGNOSIS — F1510 Other stimulant abuse, uncomplicated: Secondary | ICD-10-CM

## 2020-03-29 DIAGNOSIS — Z813 Family history of other psychoactive substance abuse and dependence: Secondary | ICD-10-CM | POA: Diagnosis not present

## 2020-03-29 DIAGNOSIS — Z881 Allergy status to other antibiotic agents status: Secondary | ICD-10-CM | POA: Diagnosis not present

## 2020-03-29 DIAGNOSIS — N390 Urinary tract infection, site not specified: Secondary | ICD-10-CM | POA: Diagnosis present

## 2020-03-29 DIAGNOSIS — F1513 Other stimulant abuse with withdrawal: Secondary | ICD-10-CM | POA: Diagnosis present

## 2020-03-29 DIAGNOSIS — F411 Generalized anxiety disorder: Secondary | ICD-10-CM | POA: Diagnosis present

## 2020-03-29 DIAGNOSIS — Z818 Family history of other mental and behavioral disorders: Secondary | ICD-10-CM | POA: Diagnosis not present

## 2020-03-29 MED ORDER — ALUM & MAG HYDROXIDE-SIMETH 200-200-20 MG/5ML PO SUSP
30.0000 mL | ORAL | Status: DC | PRN
  Administered 2020-04-02: 30 mL via ORAL
  Filled 2020-03-29 (×3): qty 30

## 2020-03-29 MED ORDER — ACETAMINOPHEN 325 MG PO TABS
650.0000 mg | ORAL_TABLET | Freq: Four times a day (QID) | ORAL | Status: DC | PRN
  Administered 2020-03-31: 650 mg via ORAL
  Filled 2020-03-29: qty 2

## 2020-03-29 MED ORDER — HYDROXYZINE HCL 50 MG PO TABS
50.0000 mg | ORAL_TABLET | Freq: Three times a day (TID) | ORAL | Status: DC | PRN
  Administered 2020-03-29 – 2020-04-07 (×14): 50 mg via ORAL
  Filled 2020-03-29 (×15): qty 1

## 2020-03-29 MED ORDER — MAGNESIUM HYDROXIDE 400 MG/5ML PO SUSP: 30.0000 mL | Freq: Every day | ORAL | Status: DC | PRN

## 2020-03-29 MED ORDER — TRAZODONE HCL 100 MG PO TABS
100.0000 mg | ORAL_TABLET | Freq: Every evening | ORAL | Status: DC | PRN
  Administered 2020-03-29 – 2020-04-01 (×4): 100 mg via ORAL
  Filled 2020-03-29 (×5): qty 1

## 2020-03-29 NOTE — ED Notes (Signed)
Pt. Transferred to [facility] from ED to room 5 after screening for contraband. Report to include Situation, Background, Assessment and Recommendations from Noel RN. Pt. Oriented to unit including Q15 minute rounds as well as the security cameras for their protection. Patient is alert and oriented, warm and dry in no acute distress. Patient denies SI, HI, and AVH. Pt. Encouraged to let me know if needs arise.  

## 2020-03-29 NOTE — Tx Team (Signed)
Initial Treatment Plan 03/29/2020 5:51 PM Brenda Cohen NGE:[id number]    PATIENT STRESSORS: Financial difficulties Marital or family conflict Medication change or noncompliance Substance abuse   PATIENT STRENGTHS: Average or above average intelligence Capable of independent living Communication skills Physical Health Work skills   PATIENT IDENTIFIED PROBLEMS: Substance abuse   depression  Anxiety                  DISCHARGE CRITERIA:  Ability to meet basic life and health needs Improved stabilization in mood, thinking, and/or behavior Verbal commitment to aftercare and medication compliance Withdrawal symptoms are absent or subacute and managed without 24-hour nursing intervention  PRELIMINARY DISCHARGE PLAN: Attend aftercare/continuing care group Outpatient therapy Placement in alternative living arrangements  PATIENT/FAMILY INVOLVEMENT: This treatment plan has been presented to and reviewed with the patient, Brenda Cohen patient and family have been given the opportunity to ask questions and make suggestions.  Chalmers Cater, RN 03/29/2020, 5:51 PM

## 2020-03-29 NOTE — Consult Note (Signed)
[facility] Face-to-Face Psychiatry Consult   Reason for Consult: Consult for this 34 year old woman with a history of prior depression who comes into the hospital with psychosis agitation behavior problems related to recent substance abuse Referring Physician: Paduchowski Patient Identification: Brenda Cohen MRN:  [id number] Principal Diagnosis: Substance induced mood disorder (HCC) Diagnosis:  Principal Problem:   Substance induced mood disorder (HCC) Active Problems:   Major depressive disorder, recurrent episode, mild (HCC)   Methamphetamine abuse (HCC)   Total Time spent with patient: 1 hour  Subjective:   Brenda Cohen is a 34 y.o. female patient admitted with "it has been crazy".  HPI: Patient seen chart reviewed.  She tells a rambling story but ultimately it is fairly understandable.  She has been using methamphetamine for a little over a year now.  Life seems to have gone downhill pretty badly.  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  She wound up going to jail for a few weeks recently on drug charges.  After getting out tried to go back to where she was living but at that point the delusional psychotic content seems to take over.  Tells her rambling story about how people at the trailer park had prepared a haunted house that was entirely based on her.  Somehow or another she knows or think she wound up in jail and then released and then wandering around the city again.  The whole story at that point becomes extremely disorganized.  She made her way to her ex-boyfriend's home who has custody of their children.  He called 911 evidently and had her brought to the hospital.  Patient is still agitated confused delusional.  Knows that she has no place to stay.  Not engaged in any kind of mental health treatment.  Past Psychiatric History: Previously had had outpatient treatment for a  mild depression.  Drug abuse problems seem to just gotten rolling in the last year or so.  No known previous suicide attempt  Risk to Self:   Risk to Others:   Prior Inpatient Therapy:   Prior Outpatient Therapy:    Past Medical History:  Past Medical History:  Diagnosis Date  . Anxiety   . Depression     Past Surgical History:  Procedure Laterality Date  . DILATION AND EVACUATION N/A 11/08/2017   Procedure: DILATATION AND EVACUATION;  Surgeon: Nadara Mustard, MD;  Location: [facility] ORS;  Service: Gynecology;  Laterality: N/A;  . LAPAROSCOPIC BILATERAL SALPINGECTOMY N/A 11/08/2017   Procedure: LAPAROSCOPIC BILATERAL SALPINGECTOMY;  Surgeon: Nadara Mustard, MD;  Location: [facility] ORS;  Service: Gynecology;  Laterality: N/A;  . ORIF CLAVICULAR FRACTURE Left 12/05/2018   Procedure: OPEN REDUCTION INTERNAL FIXATION OF PROXIMAL CLAVICLE FRACTURE;  Surgeon: Christena Flake, MD;  Location: [facility] ORS;  Service: Orthopedics;  Laterality: Left;  . TUBAL LIGATION Bilateral 04/19/2015   Procedure: POST PARTUM TUBAL LIGATION;  Surgeon: Cusick Bing, MD;  Location: [facility] ORS;  Service: Gynecology;  Laterality: Bilateral;  . WISDOM TOOTH EXTRACTION     Family History:  Family History  Problem Relation Age of Onset  . Drug abuse Mother   . Hyperlipidemia Father   . Hypertension Father   . Depression Father   . Migraines Sister   . Bipolar disorder Paternal Uncle   . Schizophrenia Paternal Uncle   . OCD Paternal Uncle    Family Psychiatric  History: Reportedly some family history of bipolar disorder and substance abuse Social History:  Social History   Substance and Sexual Activity  Alcohol Use Yes  . Alcohol/week: 1.0 standard drink  . Types: 1 Glasses of wine per week   Comment: OCCAS     Social History   Substance and Sexual Activity  Drug Use Yes  . Types: Marijuana, Methamphetamines   Comment: not since she pregnant    Social History   Socioeconomic History  . Marital status:  Single    Spouse name: Not on file  . Number of children: Not on file  . Years of education: Not on file  . Highest education level: Not on file  Occupational History  . Not on file  Tobacco Use  . Smoking status: Never Smoker  . Smokeless tobacco: Never Used  Vaping Use  . Vaping Use: Never used  Substance and Sexual Activity  . Alcohol use: Yes    Alcohol/week: 1.0 standard drink    Types: 1 Glasses of wine per week    Comment: OCCAS  . Drug use: Yes    Types: Marijuana, Methamphetamines    Comment: not since she pregnant  . Sexual activity: Never    Birth control/protection: Surgical  Other Topics Concern  . Not on file  Social History Narrative  . Not on file   Social Determinants of Health   Financial Resource Strain:   . Difficulty of Paying Living Expenses: Not on file  Food Insecurity:   . Worried About Programme researcher, broadcasting/film/videounning Out of Food in the Last Year: Not on file  . Ran Out of Food in the Last Year: Not on file  Transportation Needs:   . Lack of Transportation (Medical): Not on file  . Lack of Transportation (Non-Medical): Not on file  Physical Activity:   . Days of Exercise per Week: Not on file  . Minutes of Exercise per Session: Not on file  Stress:   . Feeling of Stress : Not on file  Social Connections:   . Frequency of Communication with Friends and Family: Not on file  . Frequency of Social Gatherings with Friends and Family: Not on file  . Attends Religious Services: Not on file  . Active Member of Clubs or Organizations: Not on file  . Attends BankerClub or Organization Meetings: Not on file  . Marital Status: Not on file   Additional Social History:    Allergies:   Allergies  Allergen Reactions  . Bactrim [Sulfamethoxazole-Trimethoprim] Hives    Labs:  Results for orders placed or performed during the hospital encounter of 03/28/20 (from the past 48 hour(s))  Comprehensive metabolic panel     Status: Abnormal   Collection Time: 03/28/20  6:12 PM  Result  Value Ref Range   Sodium 133 (L) 135 - 145 mmol/L   Potassium 3.7 3.5 - 5.1 mmol/L   Chloride 100 98 - 111 mmol/L   CO2 25 22 - 32 mmol/L   Glucose, Bld 178 (H) 70 - 99 mg/dL    Comment: Glucose reference range applies only to samples taken after fasting for at least 8 hours.   BUN 25 (H) 6 - 20 mg/dL   Creatinine, Ser 0.980.90 0.44 - 1.00 mg/dL   Calcium 8.6 (L) 8.9 - 10.3 mg/dL   Total Protein 6.7 6.5 - 8.1 g/dL   Albumin 4.1 3.5 - 5.0 g/dL   AST 48 (H) 15 - 41 U/L   ALT 26 0 - 44 U/L  Alkaline Phosphatase 51 38 - 126 U/L   Total Bilirubin 1.8 (H) 0.3 - 1.2 mg/dL   GFR, Estimated >60 >73 mL/min    Comment: (NOTE) Calculated using the CKD-EPI Creatinine Equation (2021)    Anion gap 8 5 - 15    Comment: Performed at [facility], [address] [place], [place] [address]  Ethanol     Status: None   Collection Time: 03/28/20  6:12 PM  Result Value Ref Range   Alcohol, Ethyl (B) <10 <10 mg/dL    Comment: (NOTE) Lowest detectable limit for serum alcohol is 10 mg/dL.  For medical purposes only. Performed at [facility], [address] [place], [place] [address]   Salicylate level     Status: Abnormal   Collection Time: 03/28/20  6:12 PM  Result Value Ref Range   Salicylate Lvl <7.0 (L) 7.0 - 30.0 mg/dL    Comment: Performed at [facility], [address] [place], [place] [address]  Acetaminophen level     Status: Abnormal   Collection Time: 03/28/20  6:12 PM  Result Value Ref Range   Acetaminophen (Tylenol), Serum <10 (L) 10 - 30 ug/mL    Comment: (NOTE) Therapeutic concentrations vary significantly. A range of 10-30 ug/mL  may be an effective concentration for many patients. However, some  are best treated at concentrations outside of this range. Acetaminophen concentrations >150 ug/mL at 4 hours after ingestion  and >50 ug/mL at 12 hours after ingestion are often associated with  toxic reactions.  Performed at [facility], [address] [place], [place] [address]   cbc     Status: Abnormal   Collection Time: 03/28/20  6:12 PM  Result Value Ref Range   WBC 10.6 (H) 4.0 - 10.5 K/uL   RBC 4.15 3.87 - 5.11 MIL/uL   Hemoglobin 12.7 12.0 - 15.0 g/dL   HCT 38.1 36 - 46 %   MCV 90.8 80.0 - 100.0 fL   MCH 30.6 26.0 - 34.0 pg   MCHC 33.7 30.0 - 36.0 g/dL   RDW 82.9 93.7 - 16.9 %   Platelets 290 150 - 400 K/uL   nRBC 0.0 0.0 - 0.2 %    Comment: Performed at [facility], [address], [place], [place] [address]  Urine Drug Screen, Qualitative     Status: Abnormal   Collection Time: 03/28/20  6:17 PM  Result Value Ref Range   Tricyclic, Ur Screen NONE DETECTED NONE DETECTED   Amphetamines, Ur Screen POSITIVE (A) NONE DETECTED   MDMA (Ecstasy)Ur Screen NONE DETECTED NONE DETECTED   Cocaine Metabolite,Ur [place] NONE DETECTED NONE DETECTED   Opiate, Ur Screen NONE DETECTED NONE DETECTED   Phencyclidine (PCP) Ur S NONE DETECTED NONE DETECTED   Cannabinoid 50 Ng, Ur [place] NONE DETECTED NONE DETECTED   Barbiturates, Ur Screen NONE DETECTED NONE DETECTED   Benzodiazepine, Ur Scrn NONE DETECTED NONE DETECTED   Methadone Scn, Ur NONE DETECTED NONE DETECTED    Comment: (NOTE) Tricyclics + metabolites, urine    Cutoff 1000 ng/mL Amphetamines + metabolites, urine  Cutoff 1000 ng/mL MDMA (Ecstasy), urine              Cutoff 500 ng/mL Cocaine Metabolite, urine          Cutoff 300 ng/mL Opiate + metabolites, urine        Cutoff 300 ng/mL Phencyclidine (PCP), urine         Cutoff 25 ng/mL Cannabinoid, urine  Cutoff 50 ng/mL Barbiturates + metabolites, urine  Cutoff 200 ng/mL Benzodiazepine, urine              Cutoff 200 ng/mL Methadone, urine                   Cutoff 300 ng/mL  The urine drug screen provides only a preliminary, unconfirmed analytical test result and should not be used for non-medical purposes. Clinical consideration and professional judgment should be applied to any positive drug  screen result due to possible interfering substances. A more specific alternate chemical method must be used in order to obtain a confirmed analytical result. Gas chromatography / mass spectrometry (GC/MS) is the preferred confirm atory method. Performed at [facility], [address] [place], [place] [address]   Pregnancy, urine POC     Status: None   Collection Time: 03/28/20  6:22 PM  Result Value Ref Range   Preg Test, Ur NEGATIVE NEGATIVE    Comment:        THE SENSITIVITY OF THIS METHODOLOGY IS >24 mIU/mL   Respiratory Panel by RT PCR (Flu A&B, Covid) - Nasopharyngeal Swab     Status: None   Collection Time: 03/28/20  9:53 PM   Specimen: Nasopharyngeal Swab  Result Value Ref Range   SARS Coronavirus 2 by RT PCR NEGATIVE NEGATIVE    Comment: (NOTE) SARS-CoV-2 target nucleic acids are NOT DETECTED.  The SARS-CoV-2 RNA is generally detectable in upper respiratoy specimens during the acute phase of infection. The lowest concentration of SARS-CoV-2 viral copies this assay can detect is 131 copies/mL. A negative result does not preclude SARS-Cov-2 infection and should not be used as the sole basis for treatment or other patient management decisions. A negative result may occur with  improper specimen collection/handling, submission of specimen other than nasopharyngeal swab, presence of viral mutation(s) within the areas targeted by this assay, and inadequate number of viral copies (<131 copies/mL). A negative result must be combined with clinical observations, patient history, and epidemiological information. The expected result is Negative.  Fact Sheet for Patients:  https://www.moore.com/  Fact Sheet for Healthcare Providers:  https://www.young.biz/  This test is no t yet approved or cleared by the [place] FDA and  has been authorized for detection and/or diagnosis of SARS-CoV-2 by FDA under an Emergency Use  Authorization (EUA). This EUA will remain  in effect (meaning this test can be used) for the duration of the COVID-19 declaration under Section 564(b)(1) of the Act, 21 U.S.C. section 360bbb-3(b)(1), unless the authorization is terminated or revoked sooner.     Influenza A by PCR NEGATIVE NEGATIVE   Influenza B by PCR NEGATIVE NEGATIVE    Comment: (NOTE) The Xpert Xpress SARS-CoV-2/FLU/RSV assay is intended as an aid in  the diagnosis of influenza from Nasopharyngeal swab specimens and  should not be used as a sole basis for treatment. Nasal washings and  aspirates are unacceptable for Xpert Xpress SARS-CoV-2/FLU/RSV  testing.  Fact Sheet for Patients: https://www.moore.com/  Fact Sheet for Healthcare Providers: https://www.young.biz/  This test is not yet approved or cleared by the [place] FDA and  has been authorized for detection and/or diagnosis of SARS-CoV-2 by  FDA under an Emergency Use Authorization (EUA). This EUA will remain  in effect (meaning this test can be used) for the duration of the  Covid-19 declaration under Section 564(b)(1) of the Act, 21  U.S.C. section 360bbb-3(b)(1), unless the authorization is  terminated or revoked. Performed at [facility], [address] [place], [place] [address]     No current facility-administered medications for this encounter.   No current outpatient medications on file.    Musculoskeletal: Strength & Muscle Tone: within normal limits Gait & Station: normal Patient leans: N/A  Psychiatric Specialty Exam: Physical Exam Vitals and nursing note reviewed.  Constitutional:      Appearance: She is well-developed.  HENT:     Head: Normocephalic and atraumatic.  Eyes:     Conjunctiva/sclera: Conjunctivae normal.     Pupils: Pupils are equal, round, and reactive to light.  Cardiovascular:     Heart sounds: Normal heart sounds.  Pulmonary:     Effort: Pulmonary  effort is normal.  Abdominal:     Palpations: Abdomen is soft.  Musculoskeletal:        General: Normal range of motion.     Cervical back: Normal range of motion.  Skin:    General: Skin is warm and dry.  Neurological:     General: No focal deficit present.     Mental Status: She is alert.  Psychiatric:        Attention and Perception: She perceives auditory and visual hallucinations.        Mood and Affect: Mood is anxious.        Speech: Speech is tangential.        Behavior: Behavior is agitated. Behavior is not aggressive.        Thought Content: Thought content is paranoid and delusional. Thought content does not include homicidal or suicidal ideation.        Cognition and Memory: Cognition is impaired. Memory is impaired.        Judgment: Judgment is impulsive.     Review of Systems  Constitutional: Negative.   HENT: Negative.   Eyes: Negative.   Respiratory: Negative.   Cardiovascular: Negative.   Gastrointestinal: Negative.   Musculoskeletal: Negative.   Skin: Negative.   Neurological: Negative.   Psychiatric/Behavioral: Positive for behavioral problems, confusion, dysphoric mood, hallucinations and sleep disturbance. The patient is nervous/anxious and is hyperactive.     Blood pressure 94/73, pulse 92, temperature 98.5 F (36.9 C), temperature source Oral, resp. rate 18, height 5\' 1"  (1.549 m), weight 54.4 kg, last menstrual period 03/27/2020, SpO2 99 %, currently breastfeeding.Body mass index is 22.67 kg/m.  General Appearance: Disheveled  Eye Contact:  Fair  Speech:  Garbled  Volume:  Increased  Mood:  Anxious and Dysphoric  Affect:  Congruent  Thought Process:  Disorganized  Orientation:  Full (Time, Place, and Person)  Thought Content:  Illogical, Delusions, Paranoid Ideation and Rumination  Suicidal Thoughts:  No  Homicidal Thoughts:  No  Memory:  Immediate;   Fair Recent;   Poor Remote;   Poor  Judgement:  Impaired  Insight:  Shallow  Psychomotor  Activity:  Decreased  Concentration:  Concentration: Poor  Recall:  Poor  Fund of Knowledge:  Fair  Language:  Fair  Akathisia:  No  Handed:  Right  AIMS (if indicated):     Assets:  Desire for Improvement Physical Health  ADL's:  Impaired  Cognition:  Impaired,  Mild  Sleep:        Treatment Plan Summary: Daily contact with patient to assess and evaluate symptoms and progress in treatment, Medication management and Plan 34 year old woman.  Comes into the hospital because of disorganized behavior with poor self-care in the context confused and psychotic thinking.  Remains delusional and confused today.  Unable to  concentrate enough to come up with a coherent plan for self-care.  Appears to be having a persisting psychosis related to her intravenous amphetamine abuse.  No clear place to live with her ability to care for self.  Recommend inpatient hospitalization.  Patient agreeable.  As needed medicines for now while she is reassessed by downstairs treatment team.  Disposition: Recommend psychiatric Inpatient admission when medically cleared. Supportive therapy provided about ongoing stressors.  Mordecai Rasmussen, MD 03/29/2020 11:22 AM

## 2020-03-29 NOTE — ED Notes (Signed)
Hourly rounding reveals patient in room. No complaints, stable, in no acute distress. Q15 minute rounds and monitoring via Security Cameras to continue. 

## 2020-03-29 NOTE — ED Notes (Signed)
IVC/pending psych consult 

## 2020-03-29 NOTE — ED Notes (Signed)
Pt discharged to BMU under IVC.  All belongings sent with patient.  Pt accepting of disposition.  

## 2020-03-29 NOTE — Progress Notes (Signed)
Pt rates depression and anxiety 10/10. Pt says that she has AVH all the time and it is worse outside. Pt's stressors are finance, ex boyfriend, and not having her three children. She is unemployeed, her former apt was in [place] until she was evicted, no medical treatment and no support. She says that she has "no one". Pt was educated on care plan and verbalizes understanding. Pt was oriented to the unit.  Torrie Mayers RN

## 2020-03-29 NOTE — BH Assessment (Signed)
Patient is to be admitted to [facility] by Dr. Toni Amend.  Attending Physician will be Dr. Neale Burly.   Patient has been assigned to room 324, by [facility] Charge Nurse Gigi.    Dr. Lenard Lance, ER MD   Amy B., Patient's Nurse   Donella Stade., Patient Access.

## 2020-03-30 LAB — URINALYSIS, ROUTINE W REFLEX MICROSCOPIC
Bacteria, UA: NONE SEEN
Bilirubin Urine: NEGATIVE
Glucose, UA: NEGATIVE mg/dL
Hgb urine dipstick: NEGATIVE
Ketones, ur: NEGATIVE mg/dL
Nitrite: NEGATIVE
Protein, ur: NEGATIVE mg/dL
Specific Gravity, Urine: 1.006 (ref 1.005–1.030)
pH: 6 (ref 5.0–8.0)

## 2020-03-30 MED ORDER — SERTRALINE HCL 25 MG PO TABS
50.0000 mg | ORAL_TABLET | Freq: Every day | ORAL | Status: DC
  Administered 2020-03-30 – 2020-04-01 (×3): 50 mg via ORAL
  Filled 2020-03-30 (×3): qty 2

## 2020-03-30 MED ORDER — FOSFOMYCIN TROMETHAMINE 3 G PO PACK
3.0000 g | PACK | Freq: Once | ORAL | Status: AC
  Administered 2020-03-30: 3 g via ORAL
  Filled 2020-03-30: qty 3

## 2020-03-30 MED ORDER — ENSURE ENLIVE PO LIQD
237.0000 mL | Freq: Two times a day (BID) | ORAL | Status: DC
  Administered 2020-03-30 – 2020-04-04 (×7): 237 mL via ORAL

## 2020-03-30 MED ORDER — ADULT MULTIVITAMIN W/MINERALS CH
1.0000 | ORAL_TABLET | Freq: Every day | ORAL | Status: DC
  Administered 2020-03-31 – 2020-04-07 (×8): 1 via ORAL
  Filled 2020-03-30 (×8): qty 1
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
## 2020-03-30 NOTE — [facility] Suicide Risk Assessment (Signed)
[facility] INPATIENT:  Family/Significant Other Suicide Prevention Education  Suicide Prevention Education:  Education Completed; Caryn Bee McCallister/ex-husband [phone]) has been identified by the patient as the family member/significant other with whom the patient will be residing, and identified as the person(s) who will aid the patient in the event of a mental health crisis (suicidal ideations/suicide attempt).  With written consent from the patient, the family member/significant other has been provided the following suicide prevention education, prior to the and/or following the discharge of the patient.  The suicide prevention education provided includes the following:  Suicide risk factors  Suicide prevention and interventions  National Suicide Hotline telephone number  [facility] assessment telephone number  [facility] Emergency Assistance 911  [facility] and/or Residential Mobile Crisis Unit telephone number  Request made of family/significant other to:  Remove weapons (e.g., guns, rifles, knives), all items previously/currently identified as safety concern.    Remove drugs/medications (over-the-counter, prescriptions, illicit drugs), all items previously/currently identified as a safety concern.  The family member/significant other verbalizes understanding of the suicide prevention education information provided.  The family member/significant other agrees to remove the items of safety concern listed above.  McCallister shares that patient showed up to his home stating that she had no where else to go, had been using, and voicing thoughts of hurting herself. He states that patient is not supposed to show up to his house without prior authorization due to protective services for the children. He shares that he called the police after she showed up due to concerns around her safety. He explains that he believes that she is a danger to herself as she always had a  "high temper" which has been worsened due to use. He states that she is probably more of a danger to herself. He denies patient having any access to weapons of which he is aware. No other concerns expressed. Contact ended without incident.  Glenis Smoker 03/30/2020, 4:05 PM
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
## 2020-03-30 NOTE — Progress Notes (Signed)
Initial Nutrition Assessment  DOCUMENTATION CODES:   Not applicable  INTERVENTION:   Ensure Enlive po BID, each supplement provides 350 kcal and 20 grams of protein  MVI daily  NUTRITION DIAGNOSIS:   Inadequate oral intake related to social / environmental circumstances (depression, substance abuse) as evidenced by per patient/family report.  GOAL:   Patient will meet greater than or equal to 90% of their needs  MONITOR:   PO intake, Supplement acceptance  REASON FOR ASSESSMENT:   Malnutrition Screening Tool    ASSESSMENT:   34 year old woman with a history of prior depression who comes into the hospital with psychosis agitation behavior problems related to recent substance abuse  Per chart review, pt reports poor appetite and oral intake pta. Pt has continued to have poor oral intake in hospital. RD will add supplements and MVI to help pt meet her estimated needs. Per chart, pt is down ~8lbs(7%) from her UBW; RD unsure how recently weight loss occurred.   Medications and labs reviewed:   Diet Order:   Diet Order            Diet regular Room service appropriate? Yes; Fluid consistency: Thin  Diet effective now                EDUCATION NEEDS:   No education needs have been identified at this time  Skin:  Skin Assessment: Reviewed RN Assessment  Last BM:  unknown  Height:   Ht Readings from Last 1 Encounters:  03/29/20 5\' 1"  (1.549 m)    Weight:   Wt Readings from Last 1 Encounters:  03/29/20 50.8 kg    BMI:  Body mass index is 21.16 kg/m.  Estimated Nutritional Needs:   Kcal:  1500-1700kcal/day  Protein:  75-85g/day  Fluid:  >1.5L/day  03/31/20 MS, RD, LDN Please refer to [facility] for RD and/or RD on-call/weekend/after hours pager

## 2020-03-30 NOTE — Plan of Care (Signed)
  Problem: Education: Goal: Knowledge of [facility] [place] [facility] Education information/materials will improve Outcome: Progressing Goal: Emotional status will improve Outcome: Progressing Goal: Mental status will improve Outcome: Progressing Goal: Verbalization of understanding the information provided will improve Outcome: Progressing   Problem: Activity: Goal: Interest or engagement in activities will improve Outcome: Progressing Goal: Sleeping patterns will improve Outcome: Progressing   Problem: Coping: Goal: Ability to verbalize frustrations and anger appropriately will improve Outcome: Progressing Goal: Ability to demonstrate self-control will improve Outcome: Progressing   Problem: Health Behavior/Discharge Planning: Goal: Identification of resources available to assist in meeting health care needs will improve Outcome: Progressing Goal: Compliance with treatment plan for underlying cause of condition will improve Outcome: Progressing   Problem: Physical Regulation: Goal: Ability to maintain clinical measurements within normal limits will improve Outcome: Progressing   Problem: Safety: Goal: Periods of time without injury will increase Outcome: Progressing   Problem: Education: Goal: Knowledge of disease or condition will improve Outcome: Progressing Goal: Understanding of discharge needs will improve Outcome: Progressing   Problem: Health Behavior/Discharge Planning: Goal: Ability to identify changes in lifestyle to reduce recurrence of condition will improve Outcome: Progressing Goal: Identification of resources available to assist in meeting health care needs will improve Outcome: Progressing   Problem: Physical Regulation: Goal: Complications related to the disease process, condition or treatment will be avoided or minimized Outcome: Progressing   Problem: Safety: Goal: Ability to remain free from injury will improve Outcome: Progressing   Problem:  Education: Goal: Utilization of techniques to improve thought processes will improve Outcome: Progressing Goal: Knowledge of the prescribed therapeutic regimen will improve Outcome: Progressing   Problem: Activity: Goal: Interest or engagement in leisure activities will improve Outcome: Progressing Goal: Imbalance in normal sleep/wake cycle will improve Outcome: Progressing   Problem: Coping: Goal: Coping ability will improve Outcome: Progressing Goal: Will verbalize feelings Outcome: Progressing   Problem: Health Behavior/Discharge Planning: Goal: Ability to make decisions will improve Outcome: Progressing Goal: Compliance with therapeutic regimen will improve Outcome: Progressing   Problem: Role Relationship: Goal: Will demonstrate positive changes in social behaviors and relationships Outcome: Progressing   Problem: Safety: Goal: Ability to disclose and discuss suicidal ideas will improve Outcome: Progressing Goal: Ability to identify and utilize support systems that promote safety will improve Outcome: Progressing   Problem: Self-Concept: Goal: Will verbalize positive feelings about self Outcome: Progressing Goal: Level of anxiety will decrease Outcome: Progressing   Problem: Education: Goal: Ability to state activities that reduce stress will improve Outcome: Progressing   Problem: Coping: Goal: Ability to identify and develop effective coping behavior will improve Outcome: Progressing   Problem: Self-Concept: Goal: Ability to identify factors that promote anxiety will improve Outcome: Progressing Goal: Level of anxiety will decrease Outcome: Progressing Goal: Ability to modify response to factors that promote anxiety will improve Outcome: Progressing   

## 2020-03-30 NOTE — Progress Notes (Signed)
Recreation Therapy Notes  Date: 03/30/2020  Time: 9:30 am   Location: Craft room     Behavioral response: N/A   Intervention Topic: Stress Management   Discussion/Intervention: Patient did not attend group.   Clinical Observations/Feedback:  Patient did not attend group.   Marnae Madani LRT/CTRS        Aaronjames Kelsay 03/30/2020 11:18 AM

## 2020-03-30 NOTE — Progress Notes (Signed)
Patient has calmed down since coming on the unit. She reports her anxiety level has decreased and she is acclimating well to the unit. She has been active on the unit and is engaging well with others.  She is med compliant and tolerated her meds without incident.  She denied SI  HI and pain at this encounter.  She endorses AVH but states that she has been managing her symptoms. She verbally contracted for safety and is safe on the unit with 15 minute safety checks and was encouraged to contact staff with any concerns.     Cleo Butler-Nicholson, LPN
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
## 2020-03-30 NOTE — BHH Group Notes (Signed)
LCSW Group Therapy Note  03/30/2020 3:37 PM  Type of Therapy/Topic:  Group Therapy:  Feelings about Diagnosis  Participation Level:  Did Not Attend   Description of Group:   This group will allow patients to explore their thoughts and feelings about diagnoses they have received. Patients will be guided to explore their level of understanding and acceptance of these diagnoses. Facilitator will encourage patients to process their thoughts and feelings about the reactions of others to their diagnosis and will guide patients in identifying ways to discuss their diagnosis with significant others in their lives. This group will be process-oriented, with patients participating in exploration of their own experiences, giving and receiving support, and processing challenge from other group members.   Therapeutic Goals: 1. Patient will demonstrate understanding of diagnosis as evidenced by identifying two or more symptoms of the disorder 2. Patient will be able to express two feelings regarding the diagnosis 3. Patient will demonstrate their ability to communicate their needs through discussion and/or role play  Summary of Patient Progress: X   Therapeutic Modalities:   Cognitive Behavioral Therapy Brief Therapy Feelings Identification   Ted Leonhart, MSW, LCSW 03/30/2020 3:37 PM   
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
## 2020-03-30 NOTE — Progress Notes (Signed)
Recreation Therapy Notes  INPATIENT RECREATION THERAPY ASSESSMENT  Patient Details Name: Brenda Cohen MRN: [id number] DOB: 01/25/1986 Today's Date: 03/30/2020       Information Obtained From: Patient  Able to Participate in Assessment/Interview: Yes  Patient Presentation: Responsive  Reason for Admission (Per Patient): Active Symptoms, Substance Abuse  Patient Stressors:    Coping Skills:   Substance Abuse, Other (Comment) (sleep)  Leisure Interests (2+):  Art - Coloring, Art - Paint, Individual - Reading  Frequency of Recreation/Participation:    Awareness of Community Resources:     [employer]:     Current Use:    If no, Barriers?:    Expressed Interest in [employer] Information:    [place] of Residence:  Film/video editor  Patient Main Form of Transportation: Walk  Patient Strengths:  Giving;Self-less  Patient Identified Areas of Improvement:  Get off of drugs  Patient Goal for Hospitalization:  Get my state of mind back.  Current SI (including self-harm):  No  Current HI:  No  Current AVH: No  Staff Intervention Plan: Group Attendance, Collaborate with Interdisciplinary Treatment Team  Consent to Intern Participation: N/A  Brenda Cohen 03/30/2020, 3:21 PM

## 2020-03-31 LAB — URINE CULTURE: Culture: NO GROWTH
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
## 2020-03-31 NOTE — Progress Notes (Signed)
Recreation Therapy Notes  Date: 03/31/2020  Time: 9:30 am   Location: Craft room     Behavioral response: N/A   Intervention Topic: Happiness    Discussion/Intervention: Patient did not attend group.   Clinical Observations/Feedback:  Patient did not attend group.   Emannuel Vise LRT/CTRS        Cylis Ayars 03/31/2020 1:59 PM

## 2020-03-31 NOTE — Progress Notes (Signed)
Patient alert and oriented x 4, affect is flat but she brightens upon approach no distress noted she denies SI/HI/AVH, seen in the milieu briefly interacting with peers and staff, no bizarre behavior noted, she is compliant with medication regimen 15 minutes safety checks maintained will continue to monitor closely.Marland Kitchen

## 2020-03-31 NOTE — [facility] Group Notes (Signed)
LCSW Group Therapy Note  03/31/2020 4:16 PM  Type of Therapy/Topic:  Group Therapy:  Emotion Regulation  Participation Level:  Did Not Attend   Description of Group:   The purpose of this group is to assist patients in learning to regulate negative emotions and experience positive emotions. Patients will be guided to discuss ways in which they have been vulnerable to their negative emotions. These vulnerabilities will be juxtaposed with experiences of positive emotions or situations, and patients will be challenged to use positive emotions to combat negative ones. Special emphasis will be placed on coping with negative emotions in conflict situations, and patients will process healthy conflict resolution skills.  Therapeutic Goals: 1. Patient will identify two positive emotions or experiences to reflect on in order to balance out negative emotions 2. Patient will label two or more emotions that they find the most difficult to experience 3. Patient will demonstrate positive conflict resolution skills through discussion and/or role plays  Summary of Patient Progress: X  Therapeutic Modalities:   Cognitive Behavioral Therapy Feelings Identification Dialectical Behavioral Therapy  Danniel Grenz R. Algis Greenhouse, MSW, LCSW, LCAS 03/31/2020 4:16 PM
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
## 2020-03-31 NOTE — BHH Counselor (Signed)
CSW received a call from front desk that [facility] DSS came to visit the patient and verify that patient was here.    CSW spoke with patient who provided permission for [facility] to know that she is here, however, she did not provide for any other information to be released.  She requested that the [facility] DSS visit at a later date.  CSW relayed this to [facility] DSS, CPS.    Penni Homans, MSW, LCSW 03/31/2020 4:41 PM

## 2020-03-31 NOTE — BHH Group Notes (Signed)
[facility] Group Notes:  (Nursing/MHT/Case Management/Adjunct)  Date:  03/31/2020  Time:  10:21 PM  Type of Therapy:  Group Therapy  Participation Level:  Active  Participation Quality:  Appropriate  Affect:  Appropriate  Cognitive:  Alert  Insight:  Good  Engagement in Group:  Engaged and she want to be independent,want to be left along,something about an ex,and get rest.  Modes of Intervention:  Support  Summary of Progress/Problems:  Brenda Cohen 03/31/2020, 10:21 PM

## 2020-04-01 MED ORDER — SERTRALINE HCL 100 MG PO TABS
100.0000 mg | ORAL_TABLET | Freq: Every day | ORAL | Status: DC
  Administered 2020-04-02 – 2020-04-07 (×6): 100 mg via ORAL
  Filled 2020-04-01 (×6): qty 1

## 2020-04-01 MED ORDER — BISMUTH SUBSALICYLATE 262 MG PO CHEW
524.0000 mg | CHEWABLE_TABLET | Freq: Four times a day (QID) | ORAL | Status: DC | PRN
  Filled 2020-04-01: qty 2

## 2020-04-01 MED ORDER — BISMUTH SUBSALICYLATE 262 MG/15ML PO SUSP
30.0000 mL | Freq: Four times a day (QID) | ORAL | Status: DC | PRN
  Filled 2020-04-01: qty 118

## 2020-04-01 NOTE — Progress Notes (Signed)
BRIEF PHARMACY NOTE   This patient attended and participated in Medication Management Group counseling led by [facility] staff pharmacist.  This interactive class reviews basic information about prescription medications and education on personal responsibility in medication management.  The class also includes general knowledge of 3 main classes of behavioral medications, including antipsychotics, antidepressants, and mood stabilizers.     Patient behavior was appropriate for group setting.   Educational materials sourced from:  "Medication Do's and Don'ts" from [email or web address]   "Mental Health Medications" from [facility] [email or web address]     Albina Billet ,PharmD 04/01/2020 , 3:28 PM

## 2020-04-01 NOTE — BHH Counselor (Signed)
CSW faxed referral information to [facility] for review. CSW will follow up regarding receipt and potential placement.   Vilma Meckel. Algis Greenhouse, MSW, LCSW, LCAS 04/01/2020 12:14 PM

## 2020-04-01 NOTE — Progress Notes (Signed)
Recreation Therapy Notes   Date: 04/01/2020  Time: 9:30 am   Location: Craft room     Behavioral response: N/A   Intervention Topic: Communication    Discussion/Intervention: Patient did not attend group.   Clinical Observations/Feedback:  Patient did not attend group.   [name] LRT/CTRS        [name] 04/01/2020 11:30 AM

## 2020-04-01 NOTE — Progress Notes (Signed)
[facility] MD Progress Note  04/01/2020 12:17 PM Brenda Cohen  MRN:  [id number]   Subjective:  Patient seen one-on-one at bedside. She notes she is feeling very depressed and tired today. She had difficulty falling asleep that night due to anxiety and thoughts of her exboyfriend. She feels that she would benefit from a higher dose of Zoloft for anxiety and depression. Previously she was on 100 mg, will increase today. She notes that she continues to feel some withdrawals from coming off meth, and has had diarrhea. Will order pepto bismol for symptomatic relief. She remains interested in pursing inpatient substance abuse treatment. She is tolerating medications well without side effects. She denies any current suicidal ideations, homicidal ideations, or auditory hallucinations. She worries that if she leaves the hospital she will relapse and suicidal thoughts will return.   Principal Problem: Major depressive disorder, recurrent episode, mild (HCC) Diagnosis: Principal Problem:   Major depressive disorder, recurrent episode, mild (HCC) Active Problems:   Anxiety state   Methamphetamine abuse (HCC)   Amphetamine and psychostimulant-induced psychosis with delusions (HCC)  Total Time spent with patient: 45 minutes  Past Psychiatric History: Previously had had outpatient treatment for a mild depression. Drug abuse problems seem to just gotten rolling in the last year or so. No known previous suicide attempt  Past Medical History:  Past Medical History:  Diagnosis Date  . Anxiety   . Depression     Past Surgical History:  Procedure Laterality Date  . DILATION AND EVACUATION N/A 11/08/2017   Procedure: DILATATION AND EVACUATION;  Surgeon: Nadara Mustard, MD;  Location: [facility] ORS;  Service: Gynecology;  Laterality: N/A;  . LAPAROSCOPIC BILATERAL SALPINGECTOMY N/A 11/08/2017   Procedure: LAPAROSCOPIC BILATERAL SALPINGECTOMY;  Surgeon: Nadara Mustard, MD;  Location: [facility] ORS;  Service:  Gynecology;  Laterality: N/A;  . ORIF CLAVICULAR FRACTURE Left 12/05/2018   Procedure: OPEN REDUCTION INTERNAL FIXATION OF PROXIMAL CLAVICLE FRACTURE;  Surgeon: Christena Flake, MD;  Location: [facility] ORS;  Service: Orthopedics;  Laterality: Left;  . TUBAL LIGATION Bilateral 04/19/2015   Procedure: POST PARTUM TUBAL LIGATION;  Surgeon: Odessa Bing, MD;  Location: [facility] ORS;  Service: Gynecology;  Laterality: Bilateral;  . WISDOM TOOTH EXTRACTION     Family History:  Family History  Problem Relation Age of Onset  . Drug abuse Mother   . Hyperlipidemia Father   . Hypertension Father   . Depression Father   . Migraines Sister   . Bipolar disorder Paternal Uncle   . Schizophrenia Paternal Uncle   . OCD Paternal Uncle    Family Psychiatric  History: Reportedly some family history of bipolar disorder and substance abuse Social History:  Social History   Substance and Sexual Activity  Alcohol Use Yes  . Alcohol/week: 1.0 standard drink  . Types: 1 Glasses of wine per week   Comment: OCCAS     Social History   Substance and Sexual Activity  Drug Use Yes  . Types: Marijuana, Methamphetamines   Comment: not since she pregnant    Social History   Socioeconomic History  . Marital status: Single    Spouse name: Not on file  . Number of children: Not on file  . Years of education: Not on file  . Highest education level: Not on file  Occupational History  . Not on file  Tobacco Use  . Smoking status: Never Smoker  . Smokeless tobacco: Never Used  Vaping Use  . Vaping Use: Never used  Substance and Sexual  Activity  . Alcohol use: Yes    Alcohol/week: 1.0 standard drink    Types: 1 Glasses of wine per week    Comment: OCCAS  . Drug use: Yes    Types: Marijuana, Methamphetamines    Comment: not since she pregnant  . Sexual activity: Never    Birth control/protection: Surgical  Other Topics Concern  . Not on file  Social History Narrative  . Not on file   Social  Determinants of Health   Financial Resource Strain:   . Difficulty of Paying Living Expenses: Not on file  Food Insecurity:   . Worried About Programme researcher, broadcasting/film/video in the Last Year: Not on file  . Ran Out of Food in the Last Year: Not on file  Transportation Needs:   . Lack of Transportation (Medical): Not on file  . Lack of Transportation (Non-Medical): Not on file  Physical Activity:   . Days of Exercise per Week: Not on file  . Minutes of Exercise per Session: Not on file  Stress:   . Feeling of Stress : Not on file  Social Connections:   . Frequency of Communication with Friends and Family: Not on file  . Frequency of Social Gatherings with Friends and Family: Not on file  . Attends Religious Services: Not on file  . Active Member of Clubs or Organizations: Not on file  . Attends Banker Meetings: Not on file  . Marital Status: Not on file   Additional Social History:                         Sleep: Fair  Appetite:  Poor  Current Medications: Current Facility-Administered Medications  Medication Dose Route Frequency Provider Last Rate Last Admin  . acetaminophen (TYLENOL) tablet 650 mg  650 mg Oral Q6H PRN Clapacs, Jackquline Denmark, MD   650 mg at 03/31/20 0810  . alum & mag hydroxide-simeth (MAALOX/MYLANTA) 200-200-20 MG/5ML suspension 30 mL  30 mL Oral Q4H PRN Clapacs, John T, MD      . feeding supplement (ENSURE ENLIVE / ENSURE PLUS) liquid 237 mL  237 mL Oral BID BM Jesse Sans, MD   237 mL at 03/31/20 1416  . hydrOXYzine (ATARAX/VISTARIL) tablet 50 mg  50 mg Oral TID PRN Clapacs, Jackquline Denmark, MD   50 mg at 03/31/20 2109  . magnesium hydroxide (MILK OF MAGNESIA) suspension 30 mL  30 mL Oral Daily PRN Clapacs, John T, MD      . multivitamin with minerals tablet 1 tablet  1 tablet Oral Daily Jesse Sans, MD   1 tablet at 04/01/20 0820  . sertraline (ZOLOFT) tablet 50 mg  50 mg Oral Daily Jesse Sans, MD   50 mg at 04/01/20 0820  . traZODone (DESYREL)  tablet 100 mg  100 mg Oral QHS PRN Clapacs, Jackquline Denmark, MD   100 mg at 03/31/20 2109    Lab Results:  No results found for this or any previous visit (from the past 48 hour(s)).  Blood Alcohol level:  Lab Results  Component Value Date   ETH <10 03/28/2020    Metabolic Disorder Labs: No results found for: HGBA1C, MPG No results found for: PROLACTIN No results found for: CHOL, TRIG, HDL, CHOLHDL, VLDL, LDLCALC  Physical Findings: AIMS:  , ,  ,  ,    CIWA:    COWS:     Musculoskeletal: Strength & Muscle Tone: within normal limits Gait &  Station: normal Patient leans: N/A  Psychiatric Specialty Exam: Physical Exam Vitals and nursing note reviewed.  Constitutional:      Appearance: Normal appearance.  HENT:     Head: Normocephalic and atraumatic.     Right Ear: External ear normal.     Left Ear: External ear normal.     Nose: Nose normal.     Mouth/Throat:     Mouth: Mucous membranes are moist.     Pharynx: Oropharynx is clear.  Eyes:     Conjunctiva/sclera: Conjunctivae normal.     Pupils: Pupils are equal, round, and reactive to light.  Cardiovascular:     Rate and Rhythm: Normal rate.     Pulses: Normal pulses.  Pulmonary:     Effort: Pulmonary effort is normal.     Breath sounds: Normal breath sounds.  Abdominal:     General: Abdomen is flat.     Palpations: Abdomen is soft.  Musculoskeletal:        General: No swelling. Normal range of motion.     Cervical back: Normal range of motion and neck supple.  Skin:    General: Skin is warm.     Comments: Several superficial scrapes to bilateral arms  Neurological:     General: No focal deficit present.     Mental Status: She is alert and oriented to person, place, and time.  Psychiatric:        Attention and Perception: She perceives visual hallucinations.        Mood and Affect: Mood is depressed.        Speech: Speech normal.        Behavior: Behavior is slowed and withdrawn.        Thought Content: Thought  content includes suicidal ideation.        Cognition and Memory: Cognition and memory normal.        Judgment: Judgment is impulsive.     Review of Systems  Constitutional: Positive for fatigue. Negative for chills.  HENT: Negative for rhinorrhea and sore throat.   Eyes: Negative for photophobia and visual disturbance.  Respiratory: Negative for cough and shortness of breath.   Cardiovascular: Negative for chest pain and palpitations.  Gastrointestinal: Negative for constipation, diarrhea, nausea and vomiting.  Endocrine: Negative for cold intolerance and heat intolerance.  Genitourinary: Positive for frequency. Negative for dysuria.  Musculoskeletal: Negative for back pain and joint swelling.  Skin: Negative for pallor and rash.  Allergic/Immunologic: Negative for food allergies and immunocompromised state.  Neurological: Negative for dizziness and headaches.  Hematological: Negative for adenopathy. Does not bruise/bleed easily.  Psychiatric/Behavioral: Positive for dysphoric mood, hallucinations, sleep disturbance and suicidal ideas.    Blood pressure 93/64, pulse (!) 56, temperature 98.1 F (36.7 C), temperature source Oral, resp. rate 18, height 5\' 1"  (1.549 m), weight 50.8 kg, last menstrual period 03/27/2020, SpO2 99 %, currently breastfeeding.Body mass index is 21.16 kg/m.  General Appearance: Fairly Groomed  Eye Contact:  Fair  Speech:  Clear and Coherent  Volume:  Normal  Mood:  Depressed and Irritable  Affect:  Congruent  Thought Process:  Coherent  Orientation:  Full (Time, Place, and Person)  Thought Content:  Hallucinations: Visual  Suicidal Thoughts:  Yes.  without intent/plan  Homicidal Thoughts:  No  Memory:  Immediate;   Fair Recent;   Fair Remote;   Fair  Judgement:  Fair  Insight:  Fair  Psychomotor Activity:  Normal  Concentration:  Concentration: Fair and Attention Span: Fair  Recall:  Jennelle HumanFair  Fund of Knowledge:  Fair  Language:  Good  Akathisia:   Negative  Handed:  Right  AIMS (if indicated):     Assets:  Communication Skills Desire for Improvement Resilience Social Support  ADL's:  Intact  Cognition:  WNL  Sleep:  Number of Hours: 8.25     Treatment Plan Summary: Daily contact with patient to assess and evaluate symptoms and progress in treatment, Medication management and Plan Increase Zoloft to 100 mg daily. Pepto bismol for symptomatic relief of diarrhea. She is interested in long-term substance abuse treatment.  Jesse SansMegan M Cassie Henkels, MD 04/01/2020, 12:17 PM

## 2020-04-01 NOTE — BHH Counselor (Signed)
CSW faxed referral information to [facility] and [facility]. CSW gave patient copy of application for [employer] [facility] to complete.   CSW will continue to follow regarding potential placement.   Vilma Meckel. Algis Greenhouse, MSW, LCSW, LCAS 04/01/2020 3:56 PM

## 2020-04-01 NOTE — Progress Notes (Signed)
Patient alert and oriented x 4, affect is flat but she brightens upon approach  Her thoughts are organized and coherent no distress noted she's receptive to staff, and denies SI/HI/AVH, visible in the milieu briefly interacting with peers and staff, no bizarre behavior noted, she is compliant with medication regimen 15 minutes safety checks maintained will continue to monitor closely.. 

## 2020-04-01 NOTE — Plan of Care (Signed)
Patient stated that she still have anxiety and depression but she is " clearing up" her withdrawals.Denies S,HI and AVH.Patient stated that she is more out of her bed today.No PRN medications given.Compliant with medications.Appetite good,Energy level fair.Support and encouragement given.

## 2020-04-01 NOTE — BHH Group Notes (Signed)
LCSW Group Therapy Note  04/01/2020 2:59 PM  Type of Therapy/Topic:  Group Therapy:  Balance in Life  Participation Level:  Active  Description of Group:    This group will address the concept of balance and how it feels and looks when one is unbalanced. Patients will be encouraged to process areas in their lives that are out of balance and identify reasons for remaining unbalanced. Facilitators will guide patients in utilizing problem-solving interventions to address and correct the stressor making their life unbalanced. Understanding and applying boundaries will be explored and addressed for obtaining and maintaining a balanced life. Patients will be encouraged to explore ways to assertively make their unbalanced needs known to significant others in their lives, using other group members and facilitator for support and feedback.  Therapeutic Goals: 1. Patient will identify two or more emotions or situations they have that consume much of in their lives. 2. Patient will identify signs/triggers that life has become out of balance:  3. Patient will identify two ways to set boundaries in order to achieve balance in their lives:  4. Patient will demonstrate ability to communicate their needs through discussion and/or role plays  Summary of Patient Progress: Patient reports that she was working to take better care of herself. She reports that she has struggled with substances.  She reports that she is working on these things.    Therapeutic Modalities:   Cognitive Behavioral Therapy Solution-Focused Therapy Assertiveness Training  Penni Homans MSW, LCSW 04/01/2020 2:59 PM

## 2020-04-02 MED ORDER — TRAZODONE HCL 100 MG PO TABS
200.0000 mg | ORAL_TABLET | Freq: Every evening | ORAL | Status: DC | PRN
  Administered 2020-04-02 – 2020-04-06 (×5): 200 mg via ORAL
  Filled 2020-04-02 (×6): qty 2

## 2020-04-02 NOTE — Progress Notes (Signed)
Recreation Therapy Notes   Date: 04/02/2020  Time: 9:30 am   Location: Craft room   Behavioral response: Appropriate  Intervention Topic: Self-Care   Discussion/Intervention:  Group content today was focused on Self-Care. The group defined self-care and some positive ways they care for themselves. Individuals expressed ways and reasons why they neglected any self-care in the past. Patients described ways to improve self-care in the future. The group explained what could happen if they did not do any self-care activities at all. The group participated in the intervention "self-care assessment" where they had a chance to discover some of their weaknesses and strengths in self- care. Patient came up with a self-care plan to improve themselves in the future.  Clinical Observations/Feedback: Patient came to group and explained that self-care is getting enough sleep. She expressed that her anxiety keeps her from participating in self-care sometimes. Participant stated that she could improve her self-care by going to the gym.Individual was social with peers and staff while participating in the intervention.  Ayo Guarino LRT/CTRS           Mio Schellinger 04/02/2020 12:34 PM

## 2020-04-02 NOTE — BHH Counselor (Signed)
CSW contacted [facility] and [facility] to follow up regarding referrals. CSW was informed that [facility] did not currently have any treatment beds. CSW will contact [facility] on Monday, 04/05/20. [facility] also stated that they had no space today but the referrals were on the top of the waiting list. [facility] stated he would contact CSW if there were any change. CSW will continue to follow.  Vilma Meckel. Algis Greenhouse, MSW, LCSW, LCAS 04/02/2020 12:00 PM

## 2020-04-02 NOTE — Progress Notes (Signed)
[facility] MD Progress Note  04/02/2020 3:22 PM Brenda Cohen  MRN:  [id number]   Subjective:  Patient seen one-on-one at bedside. She notes she is feeling less depressed today. She has showered and put on home clothing today. She had difficulty falling asleep again overnight, and requests increase in Trazodone. She feels that Zoloft is helping with anxiety and depression, and she feels she is starting to come out of meth withdrawals. She remains interested in pursing inpatient substance abuse treatment. She is tolerating medications well without side effects. She denies any current suicidal ideations, homicidal ideations, or auditory hallucinations. She worries that if she leaves the hospital she will relapse and suicidal thoughts will return.   Principal Problem: Major depressive disorder, recurrent episode, mild (HCC) Diagnosis: Principal Problem:   Major depressive disorder, recurrent episode, mild (HCC) Active Problems:   Anxiety state   Methamphetamine abuse (HCC)   Amphetamine and psychostimulant-induced psychosis with delusions (HCC)  Total Time spent with patient: 45 minutes  Past Psychiatric History: Previously had had outpatient treatment for a mild depression. Drug abuse problems seem to just gotten rolling in the last year or so. No known previous suicide attempt  Past Medical History:  Past Medical History:  Diagnosis Date  . Anxiety   . Depression     Past Surgical History:  Procedure Laterality Date  . DILATION AND EVACUATION N/A 11/08/2017   Procedure: DILATATION AND EVACUATION;  Surgeon: Nadara Mustard, MD;  Location: [facility] OR[facility];  Service: Gynecology;  Laterality: N/A;  . LAPAROSCOPIC BILATERAL SALPINGECTOMY N/A 11/08/2017   Procedure: LAPAROSCOPIC BILATERAL SALPINGECTOMY;  Surgeon: Nadara Mustard, MD;  Location: [facility];  Service: Gynecology;  Laterality: N/A;  . ORIF CLAVICULAR FRACTURE Left 12/05/2018   Procedure: OPEN REDUCTION INTERNAL FIXATION OF PROXIMAL CLAVICLE  FRACTURE;  Surgeon: Christena Flake, MD;  Location: [facility];  Service: Orthopedics;  Laterality: Left;  . TUBAL LIGATION Bilateral 04/19/2015   Procedure: POST PARTUM TUBAL LIGATION;  Surgeon: Maroa Bing, MD;  Location: [facility];  Service: Gynecology;  Laterality: Bilateral;  . WISDOM TOOTH EXTRACTION     Family History:  Family History  Problem Relation Age of Onset  . Drug abuse Mother   . Hyperlipidemia Father   . Hypertension Father   . Depression Father   . Migraines Sister   . Bipolar disorder Paternal Uncle   . Schizophrenia Paternal Uncle   . OCD Paternal Uncle    Family Psychiatric  History: Reportedly some family history of bipolar disorder and substance abuse Social History:  Social History   Substance and Sexual Activity  Alcohol Use Yes  . Alcohol/week: 1.0 standard drink  . Types: 1 Glasses of wine per week   Comment: OCCAS     Social History   Substance and Sexual Activity  Drug Use Yes  . Types: Marijuana, Methamphetamines   Comment: not since she pregnant    Social History   Socioeconomic History  . Marital status: Single    Spouse name: Not on file  . Number of children: Not on file  . Years of education: Not on file  . Highest education level: Not on file  Occupational History  . Not on file  Tobacco Use  . Smoking status: Never Smoker  . Smokeless tobacco: Never Used  Vaping Use  . Vaping Use: Never used  Substance and Sexual Activity  . Alcohol use: Yes    Alcohol/week: 1.0 standard drink    Types: 1 Glasses of wine per week  Comment: OCCAS  . Drug use: Yes    Types: Marijuana, Methamphetamines    Comment: not since she pregnant  . Sexual activity: Never    Birth control/protection: Surgical  Other Topics Concern  . Not on file  Social History Narrative  . Not on file   Social Determinants of Health   Financial Resource Strain:   . Difficulty of Paying Living Expenses: Not on file  Food Insecurity:   . Worried About  Programme researcher, broadcasting/film/videounning Out of Food in the Last Year: Not on file  . Ran Out of Food in the Last Year: Not on file  Transportation Needs:   . Lack of Transportation (Medical): Not on file  . Lack of Transportation (Non-Medical): Not on file  Physical Activity:   . Days of Exercise per Week: Not on file  . Minutes of Exercise per Session: Not on file  Stress:   . Feeling of Stress : Not on file  Social Connections:   . Frequency of Communication with Friends and Family: Not on file  . Frequency of Social Gatherings with Friends and Family: Not on file  . Attends Religious Services: Not on file  . Active Member of Clubs or Organizations: Not on file  . Attends BankerClub or Organization Meetings: Not on file  . Marital Status: Not on file   Additional Social History:                         Sleep: Fair  Appetite:  Poor  Current Medications: Current Facility-Administered Medications  Medication Dose Route Frequency Provider Last Rate Last Admin  . acetaminophen (TYLENOL) tablet 650 mg  650 mg Oral Q6H PRN Clapacs, Jackquline DenmarkJohn T, MD   650 mg at 03/31/20 0810  . alum & mag hydroxide-simeth (MAALOX/MYLANTA) 200-200-20 MG/5ML suspension 30 mL  30 mL Oral Q4H PRN Clapacs, Jackquline DenmarkJohn T, MD   30 mL at 04/02/20 0809  . bismuth subsalicylate (PEPTO BISMOL) 262 MG/15ML suspension 30 mL  30 mL Oral Q6H PRN Jesse SansFreeman, Gurjot Brisco M, MD      . feeding supplement (ENSURE ENLIVE / ENSURE PLUS) liquid 237 mL  237 mL Oral BID BM Jesse SansFreeman, Tiffny Gemmer M, MD   237 mL at 04/01/20 1416  . hydrOXYzine (ATARAX/VISTARIL) tablet 50 mg  50 mg Oral TID PRN Clapacs, Jackquline DenmarkJohn T, MD   50 mg at 04/01/20 2117  . magnesium hydroxide (MILK OF MAGNESIA) suspension 30 mL  30 mL Oral Daily PRN Clapacs, John T, MD      . multivitamin with minerals tablet 1 tablet  1 tablet Oral Daily Jesse SansFreeman, Sadiel Mota M, MD   1 tablet at 04/02/20 0809  . sertraline (ZOLOFT) tablet 100 mg  100 mg Oral Daily Jesse SansFreeman, Janyra Barillas M, MD   100 mg at 04/02/20 0809  . traZODone (DESYREL) tablet  200 mg  200 mg Oral QHS PRN Jesse SansFreeman, Dunia Pringle M, MD        Lab Results:  No results found for this or any previous visit (from the past 48 hour(s)).  Blood Alcohol level:  Lab Results  Component Value Date   ETH <10 03/28/2020    Metabolic Disorder Labs: No results found for: HGBA1C, MPG No results found for: PROLACTIN No results found for: CHOL, TRIG, HDL, CHOLHDL, VLDL, LDLCALC  Physical Findings: AIMS:  , ,  ,  ,    CIWA:    COWS:     Musculoskeletal: Strength & Muscle Tone: within normal limits Gait & Station:  normal Patient leans: N/A  Psychiatric Specialty Exam: Physical Exam Vitals and nursing note reviewed.  Constitutional:      Appearance: Normal appearance.  HENT:     Head: Normocephalic and atraumatic.     Right Ear: External ear normal.     Left Ear: External ear normal.     Nose: Nose normal.     Mouth/Throat:     Mouth: Mucous membranes are moist.     Pharynx: Oropharynx is clear.  Eyes:     Conjunctiva/sclera: Conjunctivae normal.     Pupils: Pupils are equal, round, and reactive to light.  Cardiovascular:     Rate and Rhythm: Normal rate.     Pulses: Normal pulses.  Pulmonary:     Effort: Pulmonary effort is normal.     Breath sounds: Normal breath sounds.  Abdominal:     General: Abdomen is flat.     Palpations: Abdomen is soft.  Musculoskeletal:        General: No swelling. Normal range of motion.     Cervical back: Normal range of motion and neck supple.  Skin:    General: Skin is warm.     Comments: Several superficial scrapes to bilateral arms  Neurological:     General: No focal deficit present.     Mental Status: She is alert and oriented to person, place, and time.  Psychiatric:        Attention and Perception: She perceives visual hallucinations.        Mood and Affect: Mood is depressed.        Speech: Speech normal.        Behavior: Behavior is withdrawn. Behavior is not slowed.        Thought Content: Thought content does not  include suicidal ideation.        Cognition and Memory: Cognition and memory normal.        Judgment: Judgment is impulsive.     Review of Systems  Constitutional: Positive for fatigue. Negative for chills.  HENT: Negative for rhinorrhea and sore throat.   Eyes: Negative for photophobia and visual disturbance.  Respiratory: Negative for cough and shortness of breath.   Cardiovascular: Negative for chest pain and palpitations.  Gastrointestinal: Negative for constipation, diarrhea, nausea and vomiting.  Endocrine: Negative for cold intolerance and heat intolerance.  Genitourinary: Negative for dysuria and frequency.  Musculoskeletal: Negative for back pain and joint swelling.  Skin: Negative for pallor and rash.  Allergic/Immunologic: Negative for food allergies and immunocompromised state.  Neurological: Negative for dizziness and headaches.  Hematological: Negative for adenopathy. Does not bruise/bleed easily.  Psychiatric/Behavioral: Positive for dysphoric mood, hallucinations and sleep disturbance. Negative for suicidal ideas.    Blood pressure 96/70, pulse (!) 51, temperature 98.3 F (36.8 C), temperature source Oral, resp. rate 18, height 5\' 1"  (1.549 m), weight 50.8 kg, last menstrual period 03/27/2020, SpO2 98 %, currently breastfeeding.Body mass index is 21.16 kg/m.  General Appearance: Fairly Groomed  Eye Contact:  Fair  Speech:  Clear and Coherent  Volume:  Normal  Mood:  Depressed and Irritable  Affect:  Congruent  Thought Process:  Coherent  Orientation:  Full (Time, Place, and Person)  Thought Content:  Hallucinations: Visual  Suicidal Thoughts:  No  Homicidal Thoughts:  No  Memory:  Immediate;   Fair Recent;   Fair Remote;   Fair  Judgement:  Fair  Insight:  Fair  Psychomotor Activity:  Normal  Concentration:  Concentration: Fair and Attention Span: Fair  Recall:  Jennelle Human of Knowledge:  Fair  Language:  Good  Akathisia:  Negative  Handed:  Right  AIMS  (if indicated):     Assets:  Communication Skills Desire for Improvement Resilience Social Support  ADL's:  Intact  Cognition:  WNL  Sleep:  Number of Hours: 8.25     Treatment Plan Summary: Daily contact with patient to assess and evaluate symptoms and progress in treatment, Medication management and Plan continue Zoloft 100 mg daily for mood. Increase Trazodone to 200 mg QHS for insomnia. She is interested in long-term substance abuse treatment, and several referrals have been sent on her behalf.   Jesse Sans, MD 04/02/2020, 3:22 PM

## 2020-04-02 NOTE — [facility] Group Notes (Signed)
LCSW Group Therapy Note  04/02/2020 3:48 PM  Type of Therapy and Topic:  Group Therapy:  Feelings around Relapse and Recovery  Participation Level:  Active   Description of Group:    Patients in this group will discuss emotions they experience before and after a relapse. They will process how experiencing these feelings, or avoidance of experiencing them, relates to having a relapse. Facilitator will guide patients to explore emotions they have related to recovery. Patients will be encouraged to process which emotions are more powerful. They will be guided to discuss the emotional reaction significant others in their lives may have to their relapse or recovery. Patients will be assisted in exploring ways to respond to the emotions of others without this contributing to a relapse.  Therapeutic Goals: 1. Patient will identify two or more emotions that lead to a relapse for them 2. Patient will identify two emotions that result when they relapse 3. Patient will identify two emotions related to recovery 4. Patient will demonstrate ability to communicate their needs through discussion and/or role plays   Summary of Patient Progress: Pt participated in the group process. She shared her own struggles with relapse, feelings around relapse/recovery, and gave pertinent feedback to her peers questions.    Therapeutic Modalities:   Cognitive Behavioral Therapy Solution-Focused Therapy Assertiveness Training Relapse Prevention Therapy   Simona Huh R. Algis Greenhouse, MSW, LCSW, LCAS 04/02/2020 3:48 PM

## 2020-04-02 NOTE — Progress Notes (Signed)
Patient alert and oriented x 4, affect is flat but she brightens upon approach  Her thoughts are organized and coherent no distress noted she's receptive to staff, and denies SI/HI/AVH, visible in the milieu briefly interacting with peers and staff, no bizarre behavior noted, she is compliant with medication regimen 15 minutes safety checks maintained will continue to monitor closely.Marland Kitchen

## 2020-04-02 NOTE — Progress Notes (Signed)
Pt is alert and oriented to person, place, time and situation. Pt is calm, cooperative, pleasant. Affect is flat, brightens on contact. Pt is out for breakfast, social with peers and staff. Pt denies suicidal and homicidal ideation, denies hallucinations, denies feelings of depression and anxiety. Pt is medication complaint. Will continue to monitor pt per Q15 minute face checks and monitor for safety and progress.

## 2020-04-02 NOTE — [facility] Counselor (Signed)
CSW faxed referral paperwork to [facility]) for review regarding potential placement. CSW will continue to follow.   Vilma Meckel. Algis Greenhouse, MSW, LCSW, LCAS 04/02/2020 4:27 PM

## 2020-04-03 MED ORDER — CAMPHOR-MENTHOL 0.5-0.5 % EX LOTN
TOPICAL_LOTION | CUTANEOUS | Status: DC | PRN
  Administered 2020-04-06: 1 via TOPICAL
  Filled 2020-04-03: qty 222

## 2020-04-03 NOTE — [facility] Group Notes (Signed)
  [facility] LCSW Group Therapy Note  Date/Time:  04/03/2020 1:00 PM- 2:10 PM   Type of Therapy and Topic:  Group Therapy:  Feelings About Hospitalization  Participation Level:  Active   Description of Group This process group involved patients discussing their feelings related to being hospitalized, as well as the benefits they see to being in the hospital.  These feelings and benefits were itemized.  The group then brainstormed specific ways in which they could seek those same benefits when they discharge and return home.  Therapeutic Goals 1. Patient will identify and describe positive and negative feelings related to hospitalization 2. Patient will verbalize benefits of hospitalization to themselves personally 3. Patients will brainstorm together ways they can obtain similar benefits in the outpatient setting, identify barriers to wellness and possible solutions  Summary of Patient Progress:  Patient checked into group feeling positive. Patient stated that she willingly signed herself into the hospital and stated this was the best decision for her. Patient stated that she reached her lowest and needed help. Patient stated that being in the hospital has kept her on a routine. Patient stated her biggest challenge was taken her medication on time. Patient talked about not hanging out with the same friends when she is discharged so she can stay on track.    Therapeutic Modalities Cognitive Behavioral Therapy Motivational Interviewing    Susa Simmonds, [place] 04/03/2020  4:36 PM

## 2020-04-03 NOTE — Progress Notes (Signed)
Pt is alert and oriented to person, place, time and situation. Pt is calm, cooperative, affect is appropriately bright, pt is social and pleasant with peers and staff. Pt denies suicidal and homicidal ideation, denies anxiety and depression. Pt is out for snacks, watching movies in the dayroom. No distress noted, none reported. Will continue to monitor pt per Q15 minute face checks and monitor for safety and progress.

## 2020-04-03 NOTE — Progress Notes (Signed)
Pt is alert and oriented to person, place, time and situation. Pt denies suicidal and homicidal ideation, denies hallucinations, denies feelings of depression, reports anxiety and requests and is given PRN medication for anxiety which pt reports is effective. Pt also c/o a chronic feeling of a uterine prolapse that pt reports is not visible. Pt reports that she would like to see a gynecologist for this compliant, and plans to ask the psychiatrist tomorrow about it. Pt reports by various positioning she can achieve more comfort, pt denies related pain. Pt is visible in the dayroom and social with peers. Pt's affect is appropriately bright. No distress noted, none reported. Will continue to monitor pt per Q15 minute face checks and monitor for safety and progress.

## 2020-04-03 NOTE — Progress Notes (Signed)
[facility] MD Progress Note  04/03/2020 2:05 PM Brenda Cohen  MRN:  [id number]  Principal Problem: Major depressive disorder, recurrent episode, mild (HCC) Diagnosis: Principal Problem:   Major depressive disorder, recurrent episode, mild (HCC) Active Problems:   Anxiety state   Methamphetamine abuse (HCC)   Amphetamine and psychostimulant-induced psychosis with delusions (HCC)  Brenda Cohen is a  34y.o. female who presents to the [facility] unit for treatment of depression in the context of substance abuse.   Interval History Patient was seen today for re-evaluation.  Nursing reports no events overnight. The patient has no issues with performing ADLs.  Patient has been medication compliant.    Subjective:  On assessment patient reports feeling good, sleepy after she took Hydroxyzine for anxiety. Reports "okay" mood, denies feeling suicidal, homicidal. She participated in group today. She hopes to get into  Residential substance-abuse treatment program. She reports mild muscle cramps, no other withdrawal sx. She complaints of itchy rash on her R shoulder after she slept outside - anti-itch lotion will be prescribed.    Current suicidal/homicidal ideations: Denies Current auditory/visual hallucinations: Denies The patient reports no side effects from medications.    Labs: no new results for review.     Total Time spent with patient: 15 minutes  Past Psychiatric History: see H&P   Past Medical History:  Past Medical History:  Diagnosis Date  . Anxiety   . Depression     Past Surgical History:  Procedure Laterality Date  . DILATION AND EVACUATION N/A 11/08/2017   Procedure: DILATATION AND EVACUATION;  Surgeon: Nadara Mustard, MD;  Location: [facility] ORS;  Service: Gynecology;  Laterality: N/A;  . LAPAROSCOPIC BILATERAL SALPINGECTOMY N/A 11/08/2017   Procedure: LAPAROSCOPIC BILATERAL SALPINGECTOMY;  Surgeon: Nadara Mustard, MD;  Location: [facility] ORS;  Service: Gynecology;  Laterality:  N/A;  . ORIF CLAVICULAR FRACTURE Left 12/05/2018   Procedure: OPEN REDUCTION INTERNAL FIXATION OF PROXIMAL CLAVICLE FRACTURE;  Surgeon: Christena Flake, MD;  Location: [facility] ORS;  Service: Orthopedics;  Laterality: Left;  . TUBAL LIGATION Bilateral 04/19/2015   Procedure: POST PARTUM TUBAL LIGATION;  Surgeon: [place] Bing, MD;  Location: [facility] ORS;  Service: Gynecology;  Laterality: Bilateral;  . WISDOM TOOTH EXTRACTION     Family History:  Family History  Problem Relation Age of Onset  . Drug abuse Mother   . Hyperlipidemia Father   . Hypertension Father   . Depression Father   . Migraines Sister   . Bipolar disorder Paternal Uncle   . Schizophrenia Paternal Uncle   . OCD Paternal Uncle    Family Psychiatric  History: see H&P  Social History:  Social History   Substance and Sexual Activity  Alcohol Use Yes  . Alcohol/week: 1.0 standard drink  . Types: 1 Glasses of wine per week   Comment: OCCAS     Social History   Substance and Sexual Activity  Drug Use Yes  . Types: Marijuana, Methamphetamines   Comment: not since she pregnant    Social History   Socioeconomic History  . Marital status: Single    Spouse name: Not on file  . Number of children: Not on file  . Years of education: Not on file  . Highest education level: Not on file  Occupational History  . Not on file  Tobacco Use  . Smoking status: Never Smoker  . Smokeless tobacco: Never Used  Vaping Use  . Vaping Use: Never used  Substance and Sexual Activity  . Alcohol use: Yes  Alcohol/week: 1.0 standard drink    Types: 1 Glasses of wine per week    Comment: OCCAS  . Drug use: Yes    Types: Marijuana, Methamphetamines    Comment: not since she pregnant  . Sexual activity: Never    Birth control/protection: Surgical  Other Topics Concern  . Not on file  Social History Narrative  . Not on file   Social Determinants of Health   Financial Resource Strain:   . Difficulty of Paying Living Expenses:  Not on file  Food Insecurity:   . Worried About Programme researcher, broadcasting/film/video in the Last Year: Not on file  . Ran Out of Food in the Last Year: Not on file  Transportation Needs:   . Lack of Transportation (Medical): Not on file  . Lack of Transportation (Non-Medical): Not on file  Physical Activity:   . Days of Exercise per Week: Not on file  . Minutes of Exercise per Session: Not on file  Stress:   . Feeling of Stress : Not on file  Social Connections:   . Frequency of Communication with Friends and Family: Not on file  . Frequency of Social Gatherings with Friends and Family: Not on file  . Attends Religious Services: Not on file  . Active Member of Clubs or Organizations: Not on file  . Attends Banker Meetings: Not on file  . Marital Status: Not on file   Additional Social History:                         Sleep: Fair  Appetite:  Fair  Current Medications: Current Facility-Administered Medications  Medication Dose Route Frequency Provider Last Rate Last Admin  . acetaminophen (TYLENOL) tablet 650 mg  650 mg Oral Q6H PRN Clapacs, Jackquline Denmark, MD   650 mg at 03/31/20 0810  . alum & mag hydroxide-simeth (MAALOX/MYLANTA) 200-200-20 MG/5ML suspension 30 mL  30 mL Oral Q4H PRN Clapacs, Jackquline Denmark, MD   30 mL at 04/02/20 0809  . bismuth subsalicylate (PEPTO BISMOL) 262 MG/15ML suspension 30 mL  30 mL Oral Q6H PRN Jesse Sans, MD      . feeding supplement (ENSURE ENLIVE / ENSURE PLUS) liquid 237 mL  237 mL Oral BID BM Jesse Sans, MD   237 mL at 04/01/20 1416  . hydrOXYzine (ATARAX/VISTARIL) tablet 50 mg  50 mg Oral TID PRN Clapacs, Jackquline Denmark, MD   50 mg at 04/03/20 0829  . magnesium hydroxide (MILK OF MAGNESIA) suspension 30 mL  30 mL Oral Daily PRN Clapacs, John T, MD      . multivitamin with minerals tablet 1 tablet  1 tablet Oral Daily Jesse Sans, MD   1 tablet at 04/03/20 0825  . sertraline (ZOLOFT) tablet 100 mg  100 mg Oral Daily Jesse Sans, MD   100 mg  at 04/03/20 0825  . traZODone (DESYREL) tablet 200 mg  200 mg Oral QHS PRN Jesse Sans, MD   200 mg at 04/02/20 2105    Lab Results: No results found for this or any previous visit (from the past 48 hour(s)).  Blood Alcohol level:  Lab Results  Component Value Date   ETH <10 03/28/2020    Metabolic Disorder Labs: No results found for: HGBA1C, MPG No results found for: PROLACTIN No results found for: CHOL, TRIG, HDL, CHOLHDL, VLDL, LDLCALC  Physical Findings: AIMS:  , ,  ,  ,    CIWA:  COWS:     Musculoskeletal: Strength & Muscle Tone: within normal limits Gait & Station: normal Patient leans: N/A  Psychiatric Specialty Exam: Physical Exam  Review of Systems  Blood pressure (!) 95/57, pulse 60, temperature 98.4 F (36.9 C), temperature source Oral, resp. rate 18, height 5\' 1"  (1.549 m), weight 50.8 kg, last menstrual period 03/27/2020, SpO2 98 %, currently breastfeeding.Body mass index is 21.16 kg/m.  General Appearance: Casual  Eye Contact:  Good  Speech:  Normal Rate  Volume:  Normal  Mood:  Euthymic  Affect:  Full Range  Thought Process:  Coherent, Goal Directed and Linear  Orientation:  Full (Time, Place, and Person)  Thought Content:  Logical  Suicidal Thoughts:  No  Homicidal Thoughts:  No  Memory:  Immediate;   Fair Recent;   Fair Remote;   Fair  Judgement:  Fair  Insight:  Fair  Psychomotor Activity:  Normal  Concentration:  Concentration: Fair and Attention Span: Fair  Recall:  03/29/2020 of Knowledge:  Fair  Language:  Fair  Akathisia:  No  Handed:  Right  AIMS (if indicated):     Assets:  Communication Skills Desire for Improvement Resilience  ADL's:  Intact  Cognition:  WNL  Sleep:  Number of Hours: 8     Treatment Plan Summary: Daily contact with patient to assess and evaluate symptoms and progress in treatment and Medication management  Patient is a 34 year old female/female with the above-stated past psychiatric history who is  seen in follow-up.  Chart reviewed. Patient discussed with nursing. Patient reports mood improvement. No unsafe thoughts. No med changes today. Referrals sent to substance-abuse tx program.    Plan:  -continue inpatient psych admission; 15-minute checks; daily contact with patient to assess and evaluate symptoms and progress in treatment; psychoeducation.  -continue scheduled medications: . feeding supplement  237 mL Oral BID BM  . multivitamin with minerals  1 tablet Oral Daily  . sertraline  100 mg Oral Daily    -continue PRN medications.  acetaminophen, alum & mag hydroxide-simeth, bismuth subsalicylate, camphor-menthol, hydrOXYzine, magnesium hydroxide, traZODone  -Pertinent Labs: no new labs ordered today    -Consults: No new consults placed since yesterday    -Disposition: Estimated duration of hospitalization: midweek next week. Social worker to help patient get into rehab. All necessary aftercare will be arranged prior to discharge.  -  I certify that the patient does need, on a daily basis, active treatment furnished directly by or requiring the supervision of inpatient psychiatric facility personnel.   31, MD 04/03/2020, 2:05 PM

## 2020-04-04 DIAGNOSIS — F330 Major depressive disorder, recurrent, mild: Principal | ICD-10-CM

## 2020-04-04 NOTE — Plan of Care (Signed)
Patient denies SI/HI. Patient is compliant with medications. Patient provided support and encouragement. Monitoring continues. Patient remains safe on unit.   Problem: Education: Goal: Knowledge of [place] [facility] Education information/materials will improve Outcome: Progressing Goal: Emotional status will improve Outcome: Progressing Goal: Mental status will improve Outcome: Progressing Goal: Verbalization of understanding the information provided will improve Outcome: Progressing   Problem: Activity: Goal: Interest or engagement in activities will improve Outcome: Progressing Goal: Sleeping patterns will improve Outcome: Progressing   Problem: Coping: Goal: Ability to verbalize frustrations and anger appropriately will improve Outcome: Progressing Goal: Ability to demonstrate self-control will improve Outcome: Progressing   Problem: Health Behavior/Discharge Planning: Goal: Identification of resources available to assist in meeting health care needs will improve Outcome: Progressing Goal: Compliance with treatment plan for underlying cause of condition will improve Outcome: Progressing   Problem: Physical Regulation: Goal: Ability to maintain clinical measurements within normal limits will improve Outcome: Progressing   Problem: Safety: Goal: Periods of time without injury will increase Outcome: Progressing   Problem: Safety: Goal: Ability to remain free from injury will improve Outcome: Progressing   Problem: Education: Goal: Utilization of techniques to improve thought processes will improve Outcome: Progressing   Problem: Activity: Goal: Interest or engagement in leisure activities will improve Outcome: Progressing   Problem: Coping: Goal: Coping ability will improve Outcome: Progressing Goal: Will verbalize feelings Outcome: Progressing   Problem: Health Behavior/Discharge Planning: Goal: Ability to make decisions will improve Outcome:  Progressing Goal: Compliance with therapeutic regimen will improve Outcome: Progressing   Problem: Self-Concept: Goal: Level of anxiety will decrease Outcome: Progressing   Problem: Education: Goal: Ability to state activities that reduce stress will improve Outcome: Progressing   Problem: Self-Concept: Goal: Ability to identify factors that promote anxiety will improve Outcome: Progressing Goal: Level of anxiety will decrease Outcome: Progressing

## 2020-04-04 NOTE — Progress Notes (Signed)
[facility] MD Progress Note  04/04/2020 11:49 AM Brenda Cohen  MRN:  [id number]  Principal Problem: Major depressive disorder, recurrent episode, mild (HCC) Diagnosis: Principal Problem:   Major depressive disorder, recurrent episode, mild (HCC) Active Problems:   Anxiety state   Methamphetamine abuse (HCC)   Amphetamine and psychostimulant-induced psychosis with delusions (HCC)  Brenda Cohen is a  34y.o. female who presents to the [facility] unit for treatment of depression in the context of substance abuse.   Interval History Patient was seen today for re-evaluation.  Nursing reports no events overnight. The patient has no issues with performing ADLs.  Patient has been medication compliant.    Subjective:  On assessment patient reports feeling good mood-wise and physically. No withdrawal symptoms. Reports "okay" mood, denies feeling suicidal, homicidal. She hopes to get into [facility]idential substance-abuse treatment program; she is homeless and has no place to stay; she reports that she needs structured environment to avoid relapse on drugs.  Current suicidal/homicidal ideations: Denies Current auditory/visual hallucinations: Denies The patient reports no side effects from medications.   She reports feeling of "heaviness in my vagina" for about two months. She reports past history of vaginal prolapse; she is asking for gynecology consult if possible.    Labs: no new results for review.     Total Time spent with patient: 15 minutes  Past Psychiatric History: see H&P   Past Medical History:  Past Medical History:  Diagnosis Date  . Anxiety   . Depression     Past Surgical History:  Procedure Laterality Date  . DILATION AND EVACUATION N/A 11/08/2017   Procedure: DILATATION AND EVACUATION;  Surgeon: Nadara Mustard, MD;  Location: [facility];  Service: Gynecology;  Laterality: N/A;  . LAPAROSCOPIC BILATERAL SALPINGECTOMY N/A 11/08/2017   Procedure: LAPAROSCOPIC BILATERAL SALPINGECTOMY;   Surgeon: Nadara Mustard, MD;  Location: [facility];  Service: Gynecology;  Laterality: N/A;  . ORIF CLAVICULAR FRACTURE Left 12/05/2018   Procedure: OPEN REDUCTION INTERNAL FIXATION OF PROXIMAL CLAVICLE FRACTURE;  Surgeon: Christena Flake, MD;  Location: [facility] OR[facility];  Service: Orthopedics;  Laterality: Left;  . TUBAL LIGATION Bilateral 04/19/2015   Procedure: POST PARTUM TUBAL LIGATION;  Surgeon: [place] Bing, MD;  Location: [facility];  Service: Gynecology;  Laterality: Bilateral;  . WISDOM TOOTH EXTRACTION     Family History:  Family History  Problem Relation Age of Onset  . Drug abuse Mother   . Hyperlipidemia Father   . Hypertension Father   . Depression Father   . Migraines Sister   . Bipolar disorder Paternal Uncle   . Schizophrenia Paternal Uncle   . OCD Paternal Uncle    Family Psychiatric  History: see H&P  Social History:  Social History   Substance and Sexual Activity  Alcohol Use Yes  . Alcohol/week: 1.0 standard drink  . Types: 1 Glasses of wine per week   Comment: OCCAS     Social History   Substance and Sexual Activity  Drug Use Yes  . Types: Marijuana, Methamphetamines   Comment: not since she pregnant    Social History   Socioeconomic History  . Marital status: Single    Spouse name: Not on file  . Number of children: Not on file  . Years of education: Not on file  . Highest education level: Not on file  Occupational History  . Not on file  Tobacco Use  . Smoking status: Never Smoker  . Smokeless tobacco: Never Used  Vaping Use  . Vaping Use:  Never used  Substance and Sexual Activity  . Alcohol use: Yes    Alcohol/week: 1.0 standard drink    Types: 1 Glasses of wine per week    Comment: OCCAS  . Drug use: Yes    Types: Marijuana, Methamphetamines    Comment: not since she pregnant  . Sexual activity: Never    Birth control/protection: Surgical  Other Topics Concern  . Not on file  Social History Narrative  . Not on file   Social  Determinants of Health   Financial Resource Strain:   . Difficulty of Paying Living Expenses: Not on file  Food Insecurity:   . Worried About Programme researcher, broadcasting/film/video in the Last Year: Not on file  . Ran Out of Food in the Last Year: Not on file  Transportation Needs:   . Lack of Transportation (Medical): Not on file  . Lack of Transportation (Non-Medical): Not on file  Physical Activity:   . Days of Exercise per Week: Not on file  . Minutes of Exercise per Session: Not on file  Stress:   . Feeling of Stress : Not on file  Social Connections:   . Frequency of Communication with Friends and Family: Not on file  . Frequency of Social Gatherings with Friends and Family: Not on file  . Attends Religious Services: Not on file  . Active Member of Clubs or Organizations: Not on file  . Attends Banker Meetings: Not on file  . Marital Status: Not on file   Additional Social History:                         Sleep: Fair  Appetite:  Fair  Current Medications: Current Facility-Administered Medications  Medication Dose Route Frequency Provider Last Rate Last Admin  . acetaminophen (TYLENOL) tablet 650 mg  650 mg Oral Q6H PRN Clapacs, Jackquline Denmark, MD   650 mg at 03/31/20 0810  . alum & mag hydroxide-simeth (MAALOX/MYLANTA) 200-200-20 MG/5ML suspension 30 mL  30 mL Oral Q4H PRN Clapacs, Jackquline Denmark, MD   30 mL at 04/02/20 0809  . bismuth subsalicylate (PEPTO BISMOL) 262 MG/15ML suspension 30 mL  30 mL Oral Q6H PRN Jesse Sans, MD      . camphor-menthol [facility]) lotion   Topical PRN Thalia Party, MD      . feeding supplement (ENSURE ENLIVE / ENSURE PLUS) liquid 237 mL  237 mL Oral BID BM Jesse Sans, MD   237 mL at 04/01/20 1416  . hydrOXYzine (ATARAX/VISTARIL) tablet 50 mg  50 mg Oral TID PRN Clapacs, Jackquline Denmark, MD   50 mg at 04/04/20 8841  . magnesium hydroxide (MILK OF MAGNESIA) suspension 30 mL  30 mL Oral Daily PRN Clapacs, John T, MD      . multivitamin with minerals  tablet 1 tablet  1 tablet Oral Daily Jesse Sans, MD   1 tablet at 04/04/20 0830  . sertraline (ZOLOFT) tablet 100 mg  100 mg Oral Daily Jesse Sans, MD   100 mg at 04/04/20 0830  . traZODone (DESYREL) tablet 200 mg  200 mg Oral QHS PRN Jesse Sans, MD   200 mg at 04/03/20 2111    Lab Results: No results found for this or any previous visit (from the past 48 hour(s)).  Blood Alcohol level:  Lab Results  Component Value Date   ETH <10 03/28/2020    Metabolic Disorder Labs: No results found for: HGBA1C,  MPG No results found for: PROLACTIN No results found for: CHOL, TRIG, HDL, CHOLHDL, VLDL, LDLCALC  Physical Findings: AIMS:  , ,  ,  ,    CIWA:    COWS:     Musculoskeletal: Strength & Muscle Tone: within normal limits Gait & Station: normal Patient leans: N/A  Psychiatric Specialty Exam: Physical Exam   Review of Systems   Blood pressure 109/67, pulse (!) 51, temperature 98.4 F (36.9 C), temperature source Oral, resp. rate 18, height 5\' 1"  (1.549 m), weight 50.8 kg, last menstrual period 03/27/2020, SpO2 99 %, currently breastfeeding.Body mass index is 21.16 kg/m.  General Appearance: Casual  Eye Contact:  Good  Speech:  Normal Rate  Volume:  Normal  Mood:  Euthymic  Affect:  Full Range  Thought Process:  Coherent, Goal Directed and Linear  Orientation:  Full (Time, Place, and Person)  Thought Content:  Logical  Suicidal Thoughts:  No  Homicidal Thoughts:  No  Memory:  Immediate;   Fair Recent;   Fair Remote;   Fair  Judgement:  Fair  Insight:  Fair  Psychomotor Activity:  Normal  Concentration:  Concentration: Fair and Attention Span: Fair  Recall:  03/29/2020 of Knowledge:  Fair  Language:  Fair  Akathisia:  No  Handed:  Right  AIMS (if indicated):     Assets:  Communication Skills Desire for Improvement Resilience  ADL's:  Intact  Cognition:  WNL  Sleep:  Number of Hours: 7.75     Treatment Plan Summary: Daily contact with patient  to assess and evaluate symptoms and progress in treatment and Medication management  Patient is a 34 year old female/female with the above-stated past psychiatric history who is seen in follow-up.  Chart reviewed. Patient discussed with nursing. Patient reports mood improvement. No unsafe thoughts. No med changes today. Referrals sent to substance-abuse tx program.    Plan:  -continue inpatient psych admission; 15-minute checks; daily contact with patient to assess and evaluate symptoms and progress in treatment; psychoeducation.  -continue scheduled medications: . feeding supplement  237 mL Oral BID BM  . multivitamin with minerals  1 tablet Oral Daily  . sertraline  100 mg Oral Daily    -continue PRN medications.  acetaminophen, alum & mag hydroxide-simeth, bismuth subsalicylate, camphor-menthol, hydrOXYzine, magnesium hydroxide, traZODone  -Pertinent Labs: no new labs ordered today    -Consults: patient is asking for Gyn consult re possible vaginal prolapse. This seems to be not urgent, will defer decision to main treatment team about need for inpatient consult vs.outpatient ObGyn referral.     -Disposition: Estimated duration of hospitalization: midweek next week. Social worker to help patient get into rehab. All necessary aftercare will be arranged prior to discharge.  -  I certify that the patient does need, on a daily basis, active treatment furnished directly by or requiring the supervision of inpatient psychiatric facility personnel.   31, MD 04/04/2020, 11:49 AM

## 2020-04-04 NOTE — [facility] Group Notes (Signed)
LCSW Group Therapy Note   04/04/2020 1:05 PM- 2:00 PM    Type of Therapy and Topic: Group Therapy: Fears and Unhealthy Coping Skills   Participation Level: Active   Description of Group: The focus of this group was to discuss some of the prevalent fears that patients experience, and to identify the commonalities among group members. An exercise was used to initiate the discussion, followed by writing on the white board a group-generated list of unhealthy coping and healthy coping techniques to deal with each fear.   Therapeutic Goals: 1. Patient will identify and describe 3 fears they experience 2. Patient will identify one positive coping strategy for each fear they experience 3. Patient will respond empathically to peers statements regarding fears they experience   Summary of Patient Progress: Patient checked into group feeling alright. Patient stated her biggest fear is starting over, being homeless, and hanging out with the same people. Patient stated that she feels she needs to leave [facility] and get away from the people that encourage her to use drugs. Patient spoke about being homeless before and having to start over. Patient stated she knows she can do it but it will be challenging. Patient stated she has resources to get furniture and other items when she finds a place to live.     Therapeutic Modalities Cognitive Behavioral Therapy Motivational Interviewing    Susa Simmonds, LCSWA 04/04/2020 3:10 PM

## 2020-04-05 MED ORDER — HYDROCERIN EX CREA
TOPICAL_CREAM | Freq: Two times a day (BID) | CUTANEOUS | Status: DC
  Filled 2020-04-05: qty 113

## 2020-04-05 MED ORDER — HYDROCORTISONE 0.5 % EX CREA
TOPICAL_CREAM | Freq: Two times a day (BID) | CUTANEOUS | Status: DC
  Administered 2020-04-06: 1 via TOPICAL
  Filled 2020-04-05: qty 28.35

## 2020-04-05 NOTE — [facility] Group Notes (Signed)
LCSW Group Therapy Note   04/05/2020 1:00 PM  Type of Therapy and Topic:  Group Therapy:  Overcoming Obstacles   Participation Level:  Active   Description of Group:    In this group patients will be encouraged to explore what they see as obstacles to their own wellness and recovery. They will be guided to discuss their thoughts, feelings, and behaviors related to these obstacles. The group will process together ways to cope with barriers, with attention given to specific choices patients can make. Each patient will be challenged to identify changes they are motivated to make in order to overcome their obstacles. This group will be process-oriented, with patients participating in exploration of their own experiences as well as giving and receiving support and challenge from other group members.   Therapeutic Goals: 1. Patient will identify personal and current obstacles as they relate to admission. 2. Patient will identify barriers that currently interfere with their wellness or overcoming obstacles.  3. Patient will identify feelings, thought process and behaviors related to these barriers. 4. Patient will identify two changes they are willing to make to overcome these obstacles:      Summary of Patient Progress Patient was present in group. Patient shared that she has struggled with taking her medications consistently and with her addiction as recent barriers to her progressing.  Patient was able to engage in a group discussion on what to do to address taking her medications consistently. She was able to discuss what protective factors that exist that support her in improving upon herself.     Therapeutic Modalities:   Cognitive Behavioral Therapy Solution Focused Therapy Motivational Interviewing Relapse Prevention Therapy  Penni Homans, MSW, LCSW 04/05/2020 12:53 PM

## 2020-04-05 NOTE — Progress Notes (Signed)
Pt is alert and oriented to person, place, time and situation. Pt is calm, cooperative, denies suicidal and homicidal ideation, denies hallucinations, denies feelings of depression. Pt reports she slept well, but still feels tired. Pt is medication complaint, visible in the dayroom, appetite is good. No distress noted, none reported. Will continue to monitor pt per Q15 minute face checks and monitor for safety and progress.

## 2020-04-05 NOTE — Progress Notes (Signed)
Patent presents to the med room for medication. She is alert and oriented. She is med compliant and tolerates her medication without incident.  She denies SI  HI AVH and pain at this encounter. She does endorse depression and anxiety, both she reports are mild. She is more verbal at this encounter reporting about past events and triggers for drug use. She is hoping for long term Tx and states that her boyfriend is not good for her if she is to ever get sober. She is safe on the unit with 15 minute safety checksand was encourage to contact staff with any concerns.     Brenda Butler-Nicholson, LPN

## 2020-04-05 NOTE — [facility] Counselor (Addendum)
CSW contacted [facility] [address] ([place]), [facility] ([facility]), and [facility] regarding placement. HIPPA complaint voicemails were left with contact information for follow up.   Vilma Meckel. Algis Greenhouse, MSW, LCSW, LCAS 04/05/2020 3:38 PM

## 2020-04-05 NOTE — Plan of Care (Signed)
  Problem: Education: Goal: Knowledge of [facility] Education information/materials will improve Outcome: Progressing Goal: Emotional status will improve Outcome: Progressing Goal: Mental status will improve Outcome: Progressing Goal: Verbalization of understanding the information provided will improve Outcome: Progressing   Problem: Activity: Goal: Interest or engagement in activities will improve Outcome: Progressing Goal: Sleeping patterns will improve Outcome: Progressing   Problem: Coping: Goal: Ability to verbalize frustrations and anger appropriately will improve Outcome: Progressing Goal: Ability to demonstrate self-control will improve Outcome: Progressing   Problem: Health Behavior/Discharge Planning: Goal: Identification of resources available to assist in meeting health care needs will improve Outcome: Progressing Goal: Compliance with treatment plan for underlying cause of condition will improve Outcome: Progressing   Problem: Physical Regulation: Goal: Ability to maintain clinical measurements within normal limits will improve Outcome: Progressing   Problem: Safety: Goal: Periods of time without injury will increase Outcome: Progressing   Problem: Education: Goal: Knowledge of disease or condition will improve Outcome: Progressing Goal: Understanding of discharge needs will improve Outcome: Progressing   Problem: Health Behavior/Discharge Planning: Goal: Ability to identify changes in lifestyle to reduce recurrence of condition will improve Outcome: Progressing Goal: Identification of resources available to assist in meeting health care needs will improve Outcome: Progressing   Problem: Physical Regulation: Goal: Complications related to the disease process, condition or treatment will be avoided or minimized Outcome: Progressing   Problem: Safety: Goal: Ability to remain free from injury will improve Outcome: Progressing   Problem:  Education: Goal: Utilization of techniques to improve thought processes will improve Outcome: Progressing Goal: Knowledge of the prescribed therapeutic regimen will improve Outcome: Progressing   Problem: Activity: Goal: Interest or engagement in leisure activities will improve Outcome: Progressing Goal: Imbalance in normal sleep/wake cycle will improve Outcome: Progressing   Problem: Coping: Goal: Coping ability will improve Outcome: Progressing Goal: Will verbalize feelings Outcome: Progressing   Problem: Health Behavior/Discharge Planning: Goal: Ability to make decisions will improve Outcome: Progressing Goal: Compliance with therapeutic regimen will improve Outcome: Progressing   Problem: Role Relationship: Goal: Will demonstrate positive changes in social behaviors and relationships Outcome: Progressing   Problem: Safety: Goal: Ability to disclose and discuss suicidal ideas will improve Outcome: Progressing Goal: Ability to identify and utilize support systems that promote safety will improve Outcome: Progressing   Problem: Self-Concept: Goal: Will verbalize positive feelings about self Outcome: Progressing Goal: Level of anxiety will decrease Outcome: Progressing   Problem: Education: Goal: Ability to state activities that reduce stress will improve Outcome: Progressing   Problem: Coping: Goal: Ability to identify and develop effective coping behavior will improve Outcome: Progressing   Problem: Self-Concept: Goal: Ability to identify factors that promote anxiety will improve Outcome: Progressing Goal: Level of anxiety will decrease Outcome: Progressing Goal: Ability to modify response to factors that promote anxiety will improve Outcome: Progressing

## 2020-04-05 NOTE — Plan of Care (Signed)
  Problem: Substance Use/Abuse Goal: STG - Patient will identify 3 social activities in an alcohol/drug, free-environment within 5 recreation therapy group sessions Description: STG - Patient will identify 3 social activities in an alcohol/drug, free-environment within 5 recreation therapy group sessions Outcome: Not Progressing

## 2020-04-05 NOTE — Progress Notes (Signed)
Recreation Therapy Notes   Date: 04/05/2020  Time: 9:30 am   Location: Craft room     Behavioral response: N/A   Intervention Topic: Necessities   Discussion/Intervention: Patient did not attend group.   Clinical Observations/Feedback:  Patient did not attend group.   Shanzay Hepworth LRT/CTRS        Breauna Mazzeo 04/05/2020 11:56 AM 

## 2020-04-05 NOTE — Progress Notes (Signed)
[facility] MD Progress Note  04/05/2020 4:33 PM Brenda Cohen  MRN:  [id number]  Principal Problem: Major depressive disorder, recurrent episode, mild (HCC) Diagnosis: Principal Problem:   Major depressive disorder, recurrent episode, mild (HCC) Active Problems:   Anxiety state   Methamphetamine abuse (HCC)   Amphetamine and psychostimulant-induced psychosis with delusions (HCC)  Brenda Cohen is a  34y.o. female who presents to the [facility] unit for treatment of depression in the context of substance abuse.   Interval History Patient was seen today for re-evaluation.  Nursing reports no events overnight. The patient has no issues with performing ADLs.  Patient has been medication compliant.    Subjective:  On assessment patient reports feeling good mood-wise and physically. No withdrawal symptoms. Reports "good" mood, denies feeling suicidal, homicidal. She hopes to get into [facility] substance[facility]use treatment program; she is homeless and has no place to stay; she reports that she needs structured environment to avoid relapse on drugs. She reports itchiness on her shoulder where several bug bites are seen. Will order hydrocortisone cream and Eucerin cream today.  Current suicidal/homicidal ideations: Denies Current auditory/visual hallucinations: Denies The patient reports no side effects from medications.   She reports feeling of "heaviness in my vagina" for about two months. She reports past history of vaginal prolapse; she is asking for gynecology referral Labs: no new results for review.     Total Time spent with patient: 30 minutes  Past Psychiatric History: see H&P   Past Medical History:  Past Medical History:  Diagnosis Date  . Anxiety   . Depression     Past Surgical History:  Procedure Laterality Date  . DILATION AND EVACUATION N/A 11/08/2017   Procedure: DILATATION AND EVACUATION;  Surgeon: Nadara Mustard, MD;  Location: [facility] ORS;  Service: Gynecology;  Laterality:  N/A;  . LAPAROSCOPIC BILATERAL SALPINGECTOMY N/A 11/08/2017   Procedure: LAPAROSCOPIC BILATERAL SALPINGECTOMY;  Surgeon: Nadara Mustard, MD;  Location: [facility] ORS;  Service: Gynecology;  Laterality: N/A;  . ORIF CLAVICULAR FRACTURE Left 12/05/2018   Procedure: OPEN REDUCTION INTERNAL FIXATION OF PROXIMAL CLAVICLE FRACTURE;  Surgeon: Christena Flake, MD;  Location: [facility] ORS;  Service: Orthopedics;  Laterality: Left;  . TUBAL LIGATION Bilateral 04/19/2015   Procedure: POST PARTUM TUBAL LIGATION;  Surgeon: [place] Bing, MD;  Location: [facility] ORS;  Service: Gynecology;  Laterality: Bilateral;  . WISDOM TOOTH EXTRACTION     Family History:  Family History  Problem Relation Age of Onset  . Drug abuse Mother   . Hyperlipidemia Father   . Hypertension Father   . Depression Father   . Migraines Sister   . Bipolar disorder Paternal Uncle   . Schizophrenia Paternal Uncle   . OCD Paternal Uncle    Family Psychiatric  History: see H&P  Social History:  Social History   Substance and Sexual Activity  Alcohol Use Yes  . Alcohol/week: 1.0 standard drink  . Types: 1 Glasses of wine per week   Comment: OCCAS     Social History   Substance and Sexual Activity  Drug Use Yes  . Types: Marijuana, Methamphetamines   Comment: not since she pregnant    Social History   Socioeconomic History  . Marital status: Divorced    Spouse name: Not on file  . Number of children: Not on file  . Years of education: Not on file  . Highest education level: Not on file  Occupational History  . Not on file  Tobacco Use  . Smoking status:  Never Smoker  . Smokeless tobacco: Never Used  Vaping Use  . Vaping Use: Never used  Substance and Sexual Activity  . Alcohol use: Yes    Alcohol/week: 1.0 standard drink    Types: 1 Glasses of wine per week    Comment: OCCAS  . Drug use: Yes    Types: Marijuana, Methamphetamines    Comment: not since she pregnant  . Sexual activity: Never    Birth  control/protection: Surgical  Other Topics Concern  . Not on file  Social History Narrative  . Not on file   Social Determinants of Health   Financial Resource Strain:   . Difficulty of Paying Living Expenses: Not on file  Food Insecurity:   . Worried About Programme researcher, broadcasting/film/videounning Out of Food in the Last Year: Not on file  . Ran Out of Food in the Last Year: Not on file  Transportation Needs:   . Lack of Transportation (Medical): Not on file  . Lack of Transportation (Non-Medical): Not on file  Physical Activity:   . Days of Exercise per Week: Not on file  . Minutes of Exercise per Session: Not on file  Stress:   . Feeling of Stress : Not on file  Social Connections:   . Frequency of Communication with Friends and Family: Not on file  . Frequency of Social Gatherings with Friends and Family: Not on file  . Attends Religious Services: Not on file  . Active Member of Clubs or Organizations: Not on file  . Attends BankerClub or Organization Meetings: Not on file  . Marital Status: Not on file   Additional Social History:                         Sleep: Fair  Appetite:  Fair  Current Medications: Current Facility-Administered Medications  Medication Dose Route Frequency Provider Last Rate Last Admin  . acetaminophen (TYLENOL) tablet 650 mg  650 mg Oral Q6H PRN Clapacs, Jackquline DenmarkJohn T, MD   650 mg at 03/31/20 0810  . alum & mag hydroxide-simeth (MAALOX/MYLANTA) 200-200-20 MG/5ML suspension 30 mL  30 mL Oral Q4H PRN Clapacs, Jackquline DenmarkJohn T, MD   30 mL at 04/02/20 0809  . bismuth subsalicylate (PEPTO BISMOL) 262 MG/15ML suspension 30 mL  30 mL Oral Q6H PRN Jesse SansFreeman, Violetta Lavalle M, MD      . camphor-menthol [facility](SARNA) lotion   Topical PRN Thalia PartyPaliy, Alisa, MD      . feeding supplement (ENSURE ENLIVE / ENSURE PLUS) liquid 237 mL  237 mL Oral BID BM Jesse SansFreeman, Diella Gillingham M, MD   237 mL at 04/04/20 1400  . hydrocerin (EUCERIN) cream   Topical BID Jesse SansFreeman, Arianne Klinge M, MD      . hydrocortisone cream 0.5 %   Topical BID Jesse SansFreeman, Kouper Spinella M,  MD      . hydrOXYzine (ATARAX/VISTARIL) tablet 50 mg  50 mg Oral TID PRN Clapacs, Jackquline DenmarkJohn T, MD   50 mg at 04/05/20 0837  . magnesium hydroxide (MILK OF MAGNESIA) suspension 30 mL  30 mL Oral Daily PRN Clapacs, John T, MD      . multivitamin with minerals tablet 1 tablet  1 tablet Oral Daily Jesse SansFreeman, Jahmarion Popoff M, MD   1 tablet at 04/05/20 [phone] [id number]  . sertraline (ZOLOFT) tablet 100 mg  100 mg Oral Daily Jesse SansFreeman, Trevone Prestwood M, MD   100 mg at 04/05/20 0836  . traZODone (DESYREL) tablet 200 mg  200 mg Oral QHS PRN Jesse SansFreeman, Beverley Allender M, MD   200  mg at 04/04/20 2130    Lab Results: No results found for this or any previous visit (from the past 48 hour(s)).  Blood Alcohol level:  Lab Results  Component Value Date   ETH <10 03/28/2020    Metabolic Disorder Labs: No results found for: HGBA1C, MPG No results found for: PROLACTIN No results found for: CHOL, TRIG, HDL, CHOLHDL, VLDL, LDLCALC  Physical Findings: AIMS:  , ,  ,  ,    CIWA:    COWS:     Musculoskeletal: Strength & Muscle Tone: within normal limits Gait & Station: normal Patient leans: N/A  Psychiatric Specialty Exam: Physical Exam Vitals and nursing note reviewed.  Constitutional:      Appearance: Normal appearance.  HENT:     Head: Normocephalic and atraumatic.     Right Ear: External ear normal.     Left Ear: External ear normal.     Nose: Nose normal.     Mouth/Throat:     Mouth: Mucous membranes are moist.     Pharynx: Oropharynx is clear.  Eyes:     Extraocular Movements: Extraocular movements intact.     Conjunctiva/sclera: Conjunctivae normal.     Pupils: Pupils are equal, round, and reactive to light.  Cardiovascular:     Rate and Rhythm: Normal rate.     Pulses: Normal pulses.  Pulmonary:     Effort: Pulmonary effort is normal.     Breath sounds: Normal breath sounds.  Abdominal:     General: Abdomen is flat.     Palpations: Abdomen is soft.  Musculoskeletal:        General: No swelling. Normal range of motion.      Cervical back: Normal range of motion and neck supple.  Skin:    General: Skin is warm and dry.  Neurological:     General: No focal deficit present.     Mental Status: She is alert and oriented to person, place, and time.  Psychiatric:        Attention and Perception: Attention and perception normal.        Mood and Affect: Mood is anxious.        Speech: Speech normal.        Behavior: Behavior is cooperative.        Thought Content: Thought content normal.        Cognition and Memory: Cognition and memory normal.        Judgment: Judgment is impulsive.     Review of Systems  Constitutional: Negative for appetite change and fatigue.  HENT: Negative for rhinorrhea and sore throat.   Eyes: Negative for photophobia and visual disturbance.  Respiratory: Negative for cough and shortness of breath.   Cardiovascular: Negative for chest pain and palpitations.  Gastrointestinal: Negative for constipation, diarrhea, nausea and vomiting.  Endocrine: Negative for cold intolerance and heat intolerance.  Genitourinary: Negative for difficulty urinating and frequency.  Musculoskeletal: Negative for arthralgias and back pain.  Skin: Negative for rash and wound.  Allergic/Immunologic: Negative for food allergies and immunocompromised state.  Neurological: Negative for dizziness and headaches.  Hematological: Negative for adenopathy. Does not bruise/bleed easily.  Psychiatric/Behavioral: Negative for hallucinations and suicidal ideas.    Blood pressure (!) 107/47, pulse 61, temperature 98.2 F (36.8 C), temperature source Oral, resp. rate 18, height 5\' 1"  (1.549 m), weight 50.8 kg, last menstrual period 03/27/2020, SpO2 99 %, currently breastfeeding.Body mass index is 21.16 kg/m.  General Appearance: Casual  Eye Contact:  Good  Speech:  Normal Rate  Volume:  Normal  Mood:  Euthymic  Affect:  Full Range  Thought Process:  Coherent, Goal Directed and Linear  Orientation:  Full (Time, Place,  and Person)  Thought Content:  Logical  Suicidal Thoughts:  No  Homicidal Thoughts:  No  Memory:  Immediate;   Fair Recent;   Fair Remote;   Fair  Judgement:  Fair  Insight:  Fair  Psychomotor Activity:  Normal  Concentration:  Concentration: Fair and Attention Span: Fair  Recall:  [employer] of Knowledge:  Fair  Language:  Fair  Akathisia:  No  Handed:  Right  AIMS (if indicated):     Assets:  Communication Skills Desire for Improvement Resilience  ADL's:  Intact  Cognition:  WNL  Sleep:  Number of Hours: 7.75     Treatment Plan Summary: Daily contact with patient to assess and evaluate symptoms and progress in treatment and Medication management  Patient is a 34 year old female/female with the above-stated past psychiatric history who is seen in follow-up.  Chart reviewed. Patient discussed with nursing. Patient reports mood improvement. No unsafe thoughts. No med changes today. Referrals sent to substance-abuse tx program.    Plan:  -continue inpatient psych admission; 15-minute checks; daily contact with patient to assess and evaluate symptoms and progress in treatment; psychoeducation.  -continue scheduled medications: . feeding supplement  237 mL Oral BID BM  . hydrocerin   Topical BID  . hydrocortisone cream   Topical BID  . multivitamin with minerals  1 tablet Oral Daily  . sertraline  100 mg Oral Daily    -continue PRN medications.  acetaminophen, alum & mag hydroxide-simeth, bismuth subsalicylate, camphor-menthol, hydrOXYzine, magnesium hydroxide, traZODone  -Pertinent Labs: no new labs ordered today    -Consults: Outpatient Gyn referral  re possible vaginal prolapse.    -Disposition: Estimated duration of hospitalization: midweek next week. Social worker to help patient get into rehab. All necessary aftercare will be arranged prior to discharge.  -  I certify that the patient does need, on a daily basis, active treatment furnished directly by or requiring  the supervision of inpatient psychiatric facility personnel.   Jesse Sans, MD 04/05/2020, 4:33 PM

## 2020-04-06 ENCOUNTER — Other Ambulatory Visit: Payer: Self-pay | Admitting: Behavioral Health

## 2020-04-06 MED ORDER — SERTRALINE HCL 100 MG PO TABS: 100.0000 mg | ORAL_TABLET | Freq: Every day | ORAL | 0 refills | Status: DC

## 2020-04-06 MED ORDER — PANTOPRAZOLE SODIUM 40 MG PO TBEC
40.0000 mg | DELAYED_RELEASE_TABLET | Freq: Every day | ORAL | Status: DC
  Administered 2020-04-06 – 2020-04-07 (×2): 40 mg via ORAL
  Filled 2020-04-06 (×2): qty 1

## 2020-04-06 MED ORDER — FAMOTIDINE 20 MG PO TABS
20.0000 mg | ORAL_TABLET | Freq: Two times a day (BID) | ORAL | Status: DC
  Administered 2020-04-06 – 2020-04-07 (×2): 20 mg via ORAL
  Filled 2020-04-06 (×2): qty 1

## 2020-04-06 MED ORDER — ALUM & MAG HYDROXIDE-SIMETH 200-200-20 MG/5ML PO SUSP
30.0000 mL | ORAL | Status: DC | PRN
  Administered 2020-04-06: 30 mL via ORAL

## 2020-04-06 MED ORDER — TRAZODONE HCL 100 MG PO TABS: 200.0000 mg | ORAL_TABLET | Freq: Every evening | ORAL | 0 refills | Status: DC | PRN

## 2020-04-06 MED ORDER — HYDROXYZINE HCL 50 MG PO TABS: 50.0000 mg | ORAL_TABLET | Freq: Three times a day (TID) | ORAL | 0 refills | Status: DC | PRN

## 2020-04-06 NOTE — Progress Notes (Signed)
Recreation Therapy Notes   Date: 04/06/2020  Time: 9:30 am   Location: Craft room     Behavioral response: N/A   Intervention Topic: Goals   Discussion/Intervention: Patient did not attend group.   Clinical Observations/Feedback:  Patient did not attend group.   Ayyub Krall LRT/CTRS         Alexzander Dolinger 04/06/2020 12:57 PM

## 2020-04-06 NOTE — Plan of Care (Signed)
Patient presents at her baseline  Problem: Education: Goal: Emotional status will improve Outcome: Progressing Goal: Mental status will improve Outcome: Progressing

## 2020-04-06 NOTE — Plan of Care (Signed)
  Problem: Education: Goal: Knowledge of [facility] Education information/materials will improve Outcome: Progressing Goal: Emotional status will improve Outcome: Progressing Goal: Mental status will improve Outcome: Progressing Goal: Verbalization of understanding the information provided will improve Outcome: Progressing   Problem: Activity: Goal: Interest or engagement in activities will improve Outcome: Progressing Goal: Sleeping patterns will improve Outcome: Progressing   Problem: Coping: Goal: Ability to verbalize frustrations and anger appropriately will improve Outcome: Progressing Goal: Ability to demonstrate self-control will improve Outcome: Progressing   Problem: Health Behavior/Discharge Planning: Goal: Identification of resources available to assist in meeting health care needs will improve Outcome: Progressing Goal: Compliance with treatment plan for underlying cause of condition will improve Outcome: Progressing   Problem: Physical Regulation: Goal: Ability to maintain clinical measurements within normal limits will improve Outcome: Progressing   Problem: Safety: Goal: Periods of time without injury will increase Outcome: Progressing   Problem: Education: Goal: Knowledge of disease or condition will improve Outcome: Progressing Goal: Understanding of discharge needs will improve Outcome: Progressing   Problem: Health Behavior/Discharge Planning: Goal: Ability to identify changes in lifestyle to reduce recurrence of condition will improve Outcome: Progressing Goal: Identification of resources available to assist in meeting health care needs will improve Outcome: Progressing   Problem: Physical Regulation: Goal: Complications related to the disease process, condition or treatment will be avoided or minimized Outcome: Progressing   Problem: Safety: Goal: Ability to remain free from injury will improve Outcome: Progressing   Problem:  Education: Goal: Utilization of techniques to improve thought processes will improve Outcome: Progressing Goal: Knowledge of the prescribed therapeutic regimen will improve Outcome: Progressing   Problem: Activity: Goal: Interest or engagement in leisure activities will improve Outcome: Progressing Goal: Imbalance in normal sleep/wake cycle will improve Outcome: Progressing   Problem: Coping: Goal: Coping ability will improve Outcome: Progressing Goal: Will verbalize feelings Outcome: Progressing   Problem: Health Behavior/Discharge Planning: Goal: Ability to make decisions will improve Outcome: Progressing Goal: Compliance with therapeutic regimen will improve Outcome: Progressing   Problem: Role Relationship: Goal: Will demonstrate positive changes in social behaviors and relationships Outcome: Progressing   Problem: Safety: Goal: Ability to disclose and discuss suicidal ideas will improve Outcome: Progressing Goal: Ability to identify and utilize support systems that promote safety will improve Outcome: Progressing   Problem: Self-Concept: Goal: Will verbalize positive feelings about self Outcome: Progressing Goal: Level of anxiety will decrease Outcome: Progressing   Problem: Education: Goal: Ability to state activities that reduce stress will improve Outcome: Progressing   Problem: Coping: Goal: Ability to identify and develop effective coping behavior will improve Outcome: Progressing   Problem: Self-Concept: Goal: Ability to identify factors that promote anxiety will improve Outcome: Progressing Goal: Level of anxiety will decrease Outcome: Progressing Goal: Ability to modify response to factors that promote anxiety will improve Outcome: Progressing   

## 2020-04-06 NOTE — Progress Notes (Signed)
Patient is alert and oriented x 4. She calm and cooperative. She is med compliant and tolerates meds without incident. She denies SI  HI  AVH depression anxiety and pain at this encounter. She reports eating and sleeping well and is ready for discharge. She continues to voice hope in getting into a log term treatment program. She is safe on  The unit with 15 minute safety checks and was encouraged to contact staff with any concerns.      Cleo Francena Hanly, LPN

## 2020-04-06 NOTE — Tx Team (Addendum)
Interdisciplinary Treatment and Diagnostic Plan Update  04/06/2020 Time of Session: 8:30AM Brenda Cohen MRN: [id number]  Principal Diagnosis: Major depressive disorder, recurrent episode, mild (HCC)  Secondary Diagnoses: Principal Problem:   Major depressive disorder, recurrent episode, mild (HCC) Active Problems:   Anxiety state   Methamphetamine abuse (HCC)   Amphetamine and psychostimulant-induced psychosis with delusions (HCC)   Current Medications:  Current Facility-Administered Medications  Medication Dose Route Frequency Provider Last Rate Last Admin  . acetaminophen (TYLENOL) tablet 650 mg  650 mg Oral Q6H PRN Clapacs, Jackquline Denmark, MD   650 mg at 03/31/20 0810  . alum & mag hydroxide-simeth (MAALOX/MYLANTA) 200-200-20 MG/5ML suspension 30 mL  30 mL Oral Q4H PRN Clapacs, Jackquline Denmark, MD   30 mL at 04/02/20 0809  . bismuth subsalicylate (PEPTO BISMOL) 262 MG/15ML suspension 30 mL  30 mL Oral Q6H PRN Jesse Sans, MD      . camphor-menthol [facility]) lotion   Topical PRN Thalia Party, MD   1 application at 04/06/20 [phone]  . feeding supplement (ENSURE ENLIVE / ENSURE PLUS) liquid 237 mL  237 mL Oral BID BM Jesse Sans, MD   237 mL at 04/04/20 1400  . hydrocerin (EUCERIN) cream   Topical BID Jesse Sans, MD   Given at 04/06/20 [phone]  . hydrocortisone cream 0.5 %   Topical BID Jesse Sans, MD   1 application at 04/06/20 [phone]  . hydrOXYzine (ATARAX/VISTARIL) tablet 50 mg  50 mg Oral TID PRN Clapacs, Jackquline Denmark, MD   50 mg at 04/06/20 0851  . magnesium hydroxide (MILK OF MAGNESIA) suspension 30 mL  30 mL Oral Daily PRN Clapacs, John T, MD      . multivitamin with minerals tablet 1 tablet  1 tablet Oral Daily Jesse Sans, MD   1 tablet at 04/06/20 0851  . sertraline (ZOLOFT) tablet 100 mg  100 mg Oral Daily Jesse Sans, MD   100 mg at 04/06/20 0852  . traZODone (DESYREL) tablet 200 mg  200 mg Oral QHS PRN Jesse Sans, MD   200 mg at 04/05/20 2134   PTA  Medications: No medications prior to admission.    Patient Stressors: Financial difficulties Marital or family conflict Medication change or noncompliance Substance abuse  Patient Strengths: Average or above average intelligence Capable of independent living Communication skills Physical Health Work skills  Treatment Modalities: Medication Management, Group therapy, Case management,  1 to 1 session with clinician, Psychoeducation, Recreational therapy.   Physician Treatment Plan for Primary Diagnosis: Major depressive disorder, recurrent episode, mild (HCC) Long Term Goal(s): Improvement in symptoms so as ready for discharge Improvement in symptoms so as ready for discharge   Short Term Goals: Ability to identify changes in lifestyle to reduce recurrence of condition will improve Ability to verbalize feelings will improve Ability to disclose and discuss suicidal ideas Ability to demonstrate self-control will improve Ability to identify and develop effective coping behaviors will improve Compliance with prescribed medications will improve Ability to identify triggers associated with substance abuse/mental health issues will improve Ability to identify changes in lifestyle to reduce recurrence of condition will improve Ability to verbalize feelings will improve Ability to disclose and discuss suicidal ideas Ability to demonstrate self-control will improve Ability to identify and develop effective coping behaviors will improve Compliance with prescribed medications will improve Ability to identify triggers associated with substance abuse/mental health issues will improve  Medication Management: Evaluate patient's response, side effects, and tolerance of medication regimen.  Therapeutic Interventions: 1 to 1 sessions, Unit Group sessions and Medication administration.  Evaluation of Outcomes: Progressing  Physician Treatment Plan for Secondary Diagnosis: Principal Problem:    Major depressive disorder, recurrent episode, mild (HCC) Active Problems:   Anxiety state   Methamphetamine abuse (HCC)   Amphetamine and psychostimulant-induced psychosis with delusions (HCC)  Long Term Goal(s): Improvement in symptoms so as ready for discharge Improvement in symptoms so as ready for discharge   Short Term Goals: Ability to identify changes in lifestyle to reduce recurrence of condition will improve Ability to verbalize feelings will improve Ability to disclose and discuss suicidal ideas Ability to demonstrate self-control will improve Ability to identify and develop effective coping behaviors will improve Compliance with prescribed medications will improve Ability to identify triggers associated with substance abuse/mental health issues will improve Ability to identify changes in lifestyle to reduce recurrence of condition will improve Ability to verbalize feelings will improve Ability to disclose and discuss suicidal ideas Ability to demonstrate self-control will improve Ability to identify and develop effective coping behaviors will improve Compliance with prescribed medications will improve Ability to identify triggers associated with substance abuse/mental health issues will improve     Medication Management: Evaluate patient's response, side effects, and tolerance of medication regimen.  Therapeutic Interventions: 1 to 1 sessions, Unit Group sessions and Medication administration.  Evaluation of Outcomes: Progressing   RN Treatment Plan for Primary Diagnosis: Major depressive disorder, recurrent episode, mild (HCC) Long Term Goal(s): Knowledge of disease and therapeutic regimen to maintain health will improve  Short Term Goals: Ability to remain free from injury will improve, Ability to verbalize frustration and anger appropriately will improve, Ability to demonstrate self-control, Ability to participate in decision making will improve, Ability to verbalize  feelings will improve, Ability to disclose and discuss suicidal ideas, Ability to identify and develop effective coping behaviors will improve and Compliance with prescribed medications will improve  Medication Management: RN will administer medications as ordered by provider, will assess and evaluate patient's response and provide education to patient for prescribed medication. RN will report any adverse and/or side effects to prescribing provider.  Therapeutic Interventions: 1 on 1 counseling sessions, Psychoeducation, Medication administration, Evaluate responses to treatment, Monitor vital signs and CBGs as ordered, Perform/monitor CIWA, COWS, AIMS and Fall Risk screenings as ordered, Perform wound care treatments as ordered.  Evaluation of Outcomes: Progressing   LCSW Treatment Plan for Primary Diagnosis: Major depressive disorder, recurrent episode, mild (HCC) Long Term Goal(s): Safe transition to appropriate next level of care at discharge, Engage patient in therapeutic group addressing interpersonal concerns.  Short Term Goals: Engage patient in aftercare planning with referrals and resources, Increase social support, Increase ability to appropriately verbalize feelings, Increase emotional regulation, Facilitate patient progression through stages of change regarding substance use diagnoses and concerns, Identify triggers associated with mental health/substance abuse issues and Increase skills for wellness and recovery  Therapeutic Interventions: Assess for all discharge needs, 1 to 1 time with Social worker, Explore available resources and support systems, Assess for adequacy in community support network, Educate family and significant other(s) on suicide prevention, Complete Psychosocial Assessment, Interpersonal group therapy.  Evaluation of Outcomes: Progressing   Progress in Treatment: Attending groups: Yes. Participating in groups: Yes. Taking medication as prescribed:  Yes. Toleration medication: Yes. Family/Significant other contact made: Yes, individual(s) contacted:  SPE completed with ex-husband, Lenard Galloway. Patient understands diagnosis: Yes. Discussing patient identified problems/goals with staff: Yes. Medical problems stabilized or resolved: Yes. Denies suicidal/homicidal ideation: Yes. Issues/concerns  per patient self-inventory: No. Other: None.  New problem(s) identified: No, Describe:  none.  New Short Term/Long Term Goal(s): detox, elimination of symptoms of psychosis, medication management for mood stabilization; elimination of SI thoughts; development of comprehensive mental wellness/sobriety plan. Update 04/06/20: No update currently.   Patient Goals: "Getting clean and staying clean. Controlling anxiety around all that has happened." Update 04/06/20: No update currently.  Discharge Plan or Barriers: Pt expresses desire for placement. Update 04/06/20: Pt has referrals. [facility] has no beds. Still waiting to hear back from [facility]), [facility], and [facility]. Pt would also like OBGYN appointment.  Reason for Continuation of Hospitalization: Depression Medication stabilization Withdrawal symptoms  Estimated Length of Stay: 1-7 days  Attendees: Patient: 04/06/2020 9:22 AM  Physician: Les Pou, MD 04/06/2020 9:22 AM  Nursing: 04/06/2020 9:22 AM  RN Care Manager: 04/06/2020 9:22 AM  Social Worker: Penni Homans, MSW, LCSW 04/06/2020 9:22 AM  Recreational Therapist: 04/06/2020 9:22 AM  Other: Vilma Meckel. Algis Greenhouse, MSW, LCSW, LCAS 04/06/2020 9:22 AM  Other:  04/06/2020 9:22 AM  Other: 04/06/2020 9:22 AM    Scribe for Treatment Team: Glenis Smoker, LCSW 04/06/2020 9:22 AM

## 2020-04-06 NOTE — [facility] Counselor (Signed)
CSW contacted [facility] Department [phone]) regarding OB/GYN services/women's health services. CSW was informed that they could not schedule appointment as their November calendar is booked and the calendar for December is not currently open. CSW was recommended to give pt number to continue following up on her own. CSW will pass this information on to pt.   Brenda Cohen. Algis Greenhouse, MSW, LCSW, LCAS 04/06/2020 3:44 PM

## 2020-04-06 NOTE — Progress Notes (Addendum)
Pt c/o heartburn pain, MD Clapacs was notified, pt given Pepcid, Maalox, and Protonix with good effect.

## 2020-04-06 NOTE — Progress Notes (Signed)
Pt is alert and oriented to person, place, time and situation. Pt is calm, cooperative, denies suicidal and homicidal ideation, denies hallucinations. Pt has a bright affect, smiles on contact, has been social with staff and peers. Pt reports she feels tired today. Pt reports her mood is good, and pt has a good appetite. No distress noted, none reported. Will continue to monitor pt per Q15 minute face checks and monitor for safety and progress.

## 2020-04-06 NOTE — [facility] Counselor (Signed)
CSW contacted the following to check on referrals:  [facility], [phone] CSW left HIPAA compliant voicemail.  [facility], [phone] CSW spoke with Drema Pry who reports that patient remains on the waitlist, however, there is a 14-21 day wait due to Covid restrictions.   [facility], [phone]  CSW left HIPAA compliant voicemail.  [facility]) [phone] CSW was transferred to [facility] and left HIPAA compliant voicemail.  Penni Homans, MSW, LCSW 04/06/2020 2:03 PM

## 2020-04-06 NOTE — Progress Notes (Signed)
Patient calm and cooperative during assessment. Patient denies SI/HI/AVH. Patient endorses anxiety. Patient compliant with medication administration per MD orders. Patient observed interacting appropriately with staff and peers on the unit. Patient given education, support, and encouragement to be active in her treatment plan. Patient stated she was leaving tomorrow. Patient being monitored Q 15 minutes for safety per unit protocol. Patient remains safe on the unit.

## 2020-04-06 NOTE — [facility] Group Notes (Signed)
LCSW Group Therapy Note  04/06/2020 2:40 PM  Type of Therapy/Topic:  Group Therapy:  Feelings about Diagnosis  Participation Level:  Did Not Attend   Description of Group:   This group will allow patients to explore their thoughts and feelings about diagnoses they have received. Patients will be guided to explore their level of understanding and acceptance of these diagnoses. Facilitator will encourage patients to process their thoughts and feelings about the reactions of others to their diagnosis and will guide patients in identifying ways to discuss their diagnosis with significant others in their lives. This group will be process-oriented, with patients participating in exploration of their own experiences, giving and receiving support, and processing challenge from other group members.   Therapeutic Goals: 1. Patient will demonstrate understanding of diagnosis as evidenced by identifying two or more symptoms of the disorder 2. Patient will be able to express two feelings regarding the diagnosis 3. Patient will demonstrate their ability to communicate their needs through discussion and/or role play  Summary of Patient Progress:  X  Therapeutic Modalities:   Cognitive Behavioral Therapy Brief Therapy Feelings Identification   Harlem Thresher R. Algis Greenhouse, MSW, LCSW, LCAS 04/06/2020 2:40 PM

## 2020-04-06 NOTE — Progress Notes (Signed)
[facility]  04/06/2020 1:56 PM Brenda Rowe PavyM Skeels  MRN:  [id number]  Principal Problem: Major depressive disorder, recurrent episode, mild (HCC) Diagnosis: Principal Problem:   Major depressive disorder, recurrent episode, mild (HCC) Active Problems:   Anxiety state   Methamphetamine abuse (HCC)   Amphetamine and psychostimulant-induced psychosis with delusions (HCC)  Brenda Cohen is a  34y.o. female [facility] of depression in the context of substance abuse.   Interval History Patient was seen today for re-evaluation.  Nursing reports no events overnight. The patient has no issues with performing ADLs.  Patient has been medication compliant.    Subjective:  On assessment patient reports feeling good mood-wise and physically. No withdrawal symptoms. Denies feeling suicidal, homicidal. Previously, she had desired residential substance-abuse treatment program. However, today she reports she feels she would like to be discharged with outpatient follow-up. She plans to stay in a hotel until she can find a job and place to live. She plans to stay away from people she was using meth with before.   Current suicidal/homicidal ideations: Denies Current auditory/visual hallucinations: Denies The patient reports no side effects from medications.   She reports feeling of "heaviness in my vagina" for about two months. She reports past history of vaginal prolapse; she is asking for gynecology referral Labs: no new results for review.     Total Time spent with patient: 30 minutes  Past Psychiatric History: see H&P   Past Medical History:  Past Medical History:  Diagnosis Date  . Anxiety   . Depression     Past Surgical History:  Procedure Laterality Date  . DILATION AND EVACUATION N/A 11/08/2017   Procedure: DILATATION AND EVACUATION;  Surgeon: Nadara MustardHarris, Robert P, MD;  Location: [facility] ORS;  Service: Gynecology;  Laterality: N/A;  . LAPAROSCOPIC  BILATERAL SALPINGECTOMY N/A 11/08/2017   Procedure: LAPAROSCOPIC BILATERAL SALPINGECTOMY;  Surgeon: Nadara MustardHarris, Robert P, MD;  Location: [facility] ORS;  Service: Gynecology;  Laterality: N/A;  . ORIF CLAVICULAR FRACTURE Left 12/05/2018   Procedure: OPEN REDUCTION INTERNAL FIXATION OF PROXIMAL CLAVICLE FRACTURE;  Surgeon: Christena FlakePoggi, John J, MD;  Location: [facility] ORS;  Service: Orthopedics;  Laterality: Left;  . TUBAL LIGATION Bilateral 04/19/2015   Procedure: POST PARTUM TUBAL LIGATION;  Surgeon: Shelby Bingharlie Pickens, MD;  Location: [facility] ORS;  Service: Gynecology;  Laterality: Bilateral;  . WISDOM TOOTH EXTRACTION     Family History:  Family History  Problem Relation Age of Onset  . Drug abuse Mother   . Hyperlipidemia Father   . Hypertension Father   . Depression Father   . Migraines Sister   . Bipolar disorder Paternal Uncle   . Schizophrenia Paternal Uncle   . OCD Paternal Uncle    Family Psychiatric  History: see H&P  Social History:  Social History   Substance and Sexual Activity  Alcohol Use Yes  . Alcohol/week: 1.0 standard drink  . Types: 1 Glasses of wine per week   Comment: OCCAS     Social History   Substance and Sexual Activity  Drug Use Yes  . Types: Marijuana, Methamphetamines   Comment: not since she pregnant    Social History   Socioeconomic History  . Marital status: Divorced    Spouse name: Not on file  . Number of children: Not on file  . Years of education: Not on file  . Highest education level: Not on file  Occupational History  . Not on file  Tobacco Use  . Smoking  status: Never Smoker  . Smokeless tobacco: Never Used  Vaping Use  . Vaping Use: Never used  Substance and Sexual Activity  . Alcohol use: Yes    Alcohol/week: 1.0 standard drink    Types: 1 Glasses of wine per week    Comment: OCCAS  . Drug use: Yes    Types: Marijuana, Methamphetamines    Comment: not since she pregnant  . Sexual activity: Never    Birth control/protection: Surgical  Other  Topics Concern  . Not on file  Social History Narrative  . Not on file   Social Determinants of Health   Financial Resource Strain:   . Difficulty of Paying Living Expenses: Not on file  Food Insecurity:   . Worried About Programme researcher, broadcasting/film/video in the Last Year: Not on file  . Ran Out of Food in the Last Year: Not on file  Transportation Needs:   . Lack of Transportation (Medical): Not on file  . Lack of Transportation (Non-Medical): Not on file  Physical Activity:   . Days of Exercise per Week: Not on file  . Minutes of Exercise per Session: Not on file  Stress:   . Feeling of Stress : Not on file  Social Connections:   . Frequency of Communication with Friends and Family: Not on file  . Frequency of Social Gatherings with Friends and Family: Not on file  . Attends Religious Services: Not on file  . Active Member of Clubs or Organizations: Not on file  . Attends Banker Meetings: Not on file  . Marital Status: Not on file   Additional Social History:                         Sleep: Fair  Appetite:  Fair  Current Medications: Current Facility-Administered Medications  Medication Dose Route Frequency Provider Last Rate Last Admin  . acetaminophen (TYLENOL) tablet 650 mg  650 mg Oral Q6H PRN Clapacs, Jackquline Denmark, MD   650 mg at 03/31/20 0810  . alum & mag hydroxide-simeth (MAALOX/MYLANTA) 200-200-20 MG/5ML suspension 30 mL  30 mL Oral Q4H PRN Clapacs, Jackquline Denmark, MD   30 mL at 04/02/20 0809  . bismuth subsalicylate (PEPTO BISMOL) 262 MG/15ML suspension 30 mL  30 mL Oral Q6H PRN Jesse Sans, MD      . camphor-menthol [facility]) lotion   Topical PRN Thalia Party, MD   1 application at 04/06/20 [phone]  . feeding supplement (ENSURE ENLIVE / ENSURE PLUS) liquid 237 mL  237 mL Oral BID BM Jesse Sans, MD   237 mL at 04/04/20 1400  . hydrocerin (EUCERIN) cream   Topical BID Jesse Sans, MD   Given at 04/06/20 [phone]  . hydrocortisone cream 0.5 %   Topical BID  Jesse Sans, MD   1 application at 04/06/20 [phone]  . hydrOXYzine (ATARAX/VISTARIL) tablet 50 mg  50 mg Oral TID PRN Clapacs, Jackquline Denmark, MD   50 mg at 04/06/20 0851  . magnesium hydroxide (MILK OF MAGNESIA) suspension 30 mL  30 mL Oral Daily PRN Clapacs, John T, MD      . multivitamin with minerals tablet 1 tablet  1 tablet Oral Daily Jesse Sans, MD   1 tablet at 04/06/20 0851  . sertraline (ZOLOFT) tablet 100 mg  100 mg Oral Daily Jesse Sans, MD   100 mg at 04/06/20 0852  . traZODone (DESYREL) tablet 200 mg  200 mg Oral  QHS PRN Jesse Sans, MD   200 mg at 04/05/20 2134    Lab Results: No results found for this or any previous visit (from the past 48 hour(s)).  Blood Alcohol level:  Lab Results  Component Value Date   ETH <10 03/28/2020    Metabolic Disorder Labs: No results found for: HGBA1C, MPG No results found for: PROLACTIN No results found for: CHOL, TRIG, HDL, CHOLHDL, VLDL, LDLCALC  Physical Findings: AIMS:  , ,  ,  ,    CIWA:    COWS:     Musculoskeletal: Strength & Muscle Tone: within normal limits Gait & Station: normal Patient leans: N/A  Psychiatric Specialty Exam: Physical Exam Vitals and nursing note reviewed.  Constitutional:      Appearance: Normal appearance.  HENT:     Head: Normocephalic and atraumatic.     Right Ear: External ear normal.     Left Ear: External ear normal.     Nose: Nose normal.     Mouth/Throat:     Mouth: Mucous membranes are moist.     Pharynx: Oropharynx is clear.  Eyes:     Extraocular Movements: Extraocular movements intact.     Conjunctiva/sclera: Conjunctivae normal.     Pupils: Pupils are equal, round, and reactive to light.  Cardiovascular:     Rate and Rhythm: Normal rate.     Pulses: Normal pulses.  Pulmonary:     Effort: Pulmonary effort is normal.     Breath sounds: Normal breath sounds.  Abdominal:     General: Abdomen is flat.     Palpations: Abdomen is soft.  Musculoskeletal:         General: No swelling. Normal range of motion.     Cervical back: Normal range of motion and neck supple.  Skin:    General: Skin is warm and dry.  Neurological:     General: No focal deficit present.     Mental Status: She is alert and oriented to person, place, and time.  Psychiatric:        Attention and Perception: Attention and perception normal.        Mood and Affect: Mood is anxious.        Speech: Speech normal.        Behavior: Behavior is cooperative.        Thought Content: Thought content normal.        Cognition and Memory: Cognition and memory normal.        Judgment: Judgment is impulsive.     Review of Systems  Constitutional: Negative for appetite change and fatigue.  HENT: Negative for rhinorrhea and sore throat.   Eyes: Negative for photophobia and visual disturbance.  Respiratory: Negative for cough and shortness of breath.   Cardiovascular: Negative for chest pain and palpitations.  Gastrointestinal: Negative for constipation, diarrhea, nausea and vomiting.  Endocrine: Negative for cold intolerance and heat intolerance.  Genitourinary: Negative for difficulty urinating and frequency.  Musculoskeletal: Negative for arthralgias and back pain.  Skin: Negative for rash and wound.  Allergic/Immunologic: Negative for food allergies and immunocompromised state.  Neurological: Negative for dizziness and headaches.  Hematological: Negative for adenopathy. Does not bruise/bleed easily.  Psychiatric/Behavioral: Negative for hallucinations and suicidal ideas.    Blood pressure 95/66, pulse 63, temperature 98.2 F (36.8 C), temperature source Oral, resp. rate 18, height 5\' 1"  (1.549 m), weight 50.8 kg, last menstrual period 03/27/2020, SpO2 98 %, currently breastfeeding.Body mass index is 21.16 kg/m.  General  Appearance: Casual  Eye Contact:  Good  Speech:  Normal Rate  Volume:  Normal  Mood:  Euthymic  Affect:  Full Range  Thought Process:  Coherent, Goal Directed  and Linear  Orientation:  Full (Time, Place, and Person)  Thought Content:  Logical  Suicidal Thoughts:  No  Homicidal Thoughts:  No  Memory:  Immediate;   Fair Recent;   Fair Remote;   Fair  Judgement:  Fair  Insight:  Fair  Psychomotor Activity:  Normal  Concentration:  Concentration: Fair and Attention Span: Fair  Recall:  [employer] of Knowledge:  Fair  Language:  Fair  Akathisia:  No  Handed:  Right  AIMS (if indicated):     Assets:  Communication Skills Desire for Improvement Resilience  ADL's:  Intact  Cognition:  WNL  Sleep:  Number of Hours: 6.75     Treatment Plan Summary: Daily contact with patient to assess and evaluate symptoms and progress in treatment and Medication management  Patient is a 34 year old female with the above-stated past psychiatric history who is seen in follow-up.  Chart reviewed. Patient discussed with nursing. Patient reports mood improvement. No unsafe thoughts. No med changes today. Will plan to discharge tomorrow with outpatient follow-up.    Plan:  -continue inpatient psych admission; 15-minute checks; daily contact with patient to assess and evaluate symptoms and progress in treatment; psychoeducation.  -continue scheduled medications: . feeding supplement  237 mL Oral BID BM  . hydrocerin   Topical BID  . hydrocortisone cream   Topical BID  . multivitamin with minerals  1 tablet Oral Daily  . sertraline  100 mg Oral Daily    -continue PRN medications.  acetaminophen, alum & mag hydroxide-simeth, bismuth subsalicylate, camphor-menthol, hydrOXYzine, magnesium hydroxide, traZODone  -Pertinent Labs: no new labs ordered today    -Consults: Outpatient Gyn referral  re possible vaginal prolapse.    -Disposition: Estimated duration of hospitalization: midweek next week. Social worker to help patient get into rehab. All necessary aftercare will be arranged prior to discharge.  -  I certify that the patient does need, on a daily  basis, active treatment furnished directly by or requiring the supervision of inpatient psychiatric facility personnel.   Jesse Sans, MD 04/06/2020, 1:56 PM

## 2020-04-07 MED ORDER — HYDROXYZINE HCL 50 MG PO TABS: 50.0000 mg | ORAL_TABLET | Freq: Three times a day (TID) | ORAL | 0 refills | Status: AC | PRN

## 2020-04-07 MED ORDER — SERTRALINE HCL 100 MG PO TABS: 100.0000 mg | ORAL_TABLET | Freq: Every day | ORAL | 1 refills | Status: AC

## 2020-04-07 MED ORDER — TRAZODONE HCL 100 MG PO TABS: 200.0000 mg | ORAL_TABLET | Freq: Every evening | ORAL | 1 refills | Status: AC | PRN

## 2020-04-07 NOTE — Progress Notes (Signed)
  [facility] Adult Case Management Discharge Plan :  Will you be returning to the same living situation after discharge:  No. At discharge, do you have transportation home?: Yes,  pt reports a friend will be providing transportation to a hotel.  Do you have the ability to pay for your medications: No.  Release of information consent forms completed and in the chart;  Patient's signature needed at discharge.  Patient to Follow up at:  Follow-up Information    [facility] Follow up.   Specialty: [facility] information: [address] [place] [address] [phone]       [facility] Follow up.   Specialty: Addiction Medicine Contact information: [address] [place] [address] [phone]        [facility] Follow up.   Contact information: [address] [place] [address] [phone]        [facility] Follow up.   Contact information: [address] [place], [place] [address] Office: [phone] Fax: [phone]       [facility] Follow up on 04/09/2020.   Why: Your appointment is scheduled for Friday, 04/09/20 at 7:45 with Lorella Nimrod, Peer Support Specialist.  Contact information: [address] [place] [address] [phone]               Next level of care provider has access to [facility] Link:no  Safety Planning and Suicide Prevention discussed: Yes,  SPE completed with Lenard Galloway, ex-husband.  Have you used any form of tobacco in the last 30 days? (Cigarettes, Smokeless Tobacco, Cigars, and/or Pipes): No  Has patient been referred to the Quitline?: N/A patient is not a smoker  Patient has been referred for addiction treatment: N/A  Glenis Smoker, LCSW 04/07/2020, 10:09 AM

## 2020-04-07 NOTE — Discharge Summary (Signed)
Physician Discharge Summary Note  Patient:  Brenda Cohen is an 34 y.o., female MRN:  [id number] DOB:  [birth year] Patient phone:  [phone] (home)  [place] [id number],  Total Time spent with patient: 45 minutes  Date of Admission:  03/29/2020 Date of Discharge: 04/07/2020  Reason for Admission:  Worsening depression with suicidal thoughts and acute psychosis secondary to methamphetamine use  Principal Problem: Major depressive disorder, recurrent episode, mild (HCC) Discharge Diagnoses: Principal Problem:   Major depressive disorder, recurrent episode, mild (HCC) Active Problems:   Anxiety state   Methamphetamine abuse (HCC)   Past Psychiatric History: Previously had had outpatient treatment for a mild depression.  Drug abuse problems seem to just gotten rolling in the last year or so.  No known previous suicide attempt  Past Medical History:  Past Medical History:  Diagnosis Date  . Anxiety   . Depression     Past Surgical History:  Procedure Laterality Date  . DILATION AND EVACUATION N/A 11/08/2017   Procedure: DILATATION AND EVACUATION;  Surgeon: Nadara MustardHarris, Robert P, MD;  Location: [facility] OR[facility];  Service: Gynecology;  Laterality: N/A;  . LAPAROSCOPIC BILATERAL SALPINGECTOMY N/A 11/08/2017   Procedure: LAPAROSCOPIC BILATERAL SALPINGECTOMY;  Surgeon: Nadara MustardHarris, Robert P, MD;  Location: [facility];  Service: Gynecology;  Laterality: N/A;  . ORIF CLAVICULAR FRACTURE Left 12/05/2018   Procedure: OPEN REDUCTION INTERNAL FIXATION OF PROXIMAL CLAVICLE FRACTURE;  Surgeon: Christena FlakePoggi, John J, MD;  Location: [facility];  Service: Orthopedics;  Laterality: Left;  . TUBAL LIGATION Bilateral 04/19/2015   Procedure: POST PARTUM TUBAL LIGATION;  Surgeon: Vandiver Bingharlie Pickens, MD;  Location: [facility];  Service: Gynecology;  Laterality: Bilateral;  . WISDOM TOOTH EXTRACTION     Family History:  Family History  Problem Relation Age of Onset  . Drug abuse Mother   .  Hyperlipidemia Father   . Hypertension Father   . Depression Father   . Migraines Sister   . Bipolar disorder Paternal Uncle   . Schizophrenia Paternal Uncle   . OCD Paternal Uncle    Family Psychiatric  History: Reportedly some family history of bipolar disorder and substance abuse Social History:  Social History   Substance and Sexual Activity  Alcohol Use Yes  . Alcohol/week: 1.0 standard drink  . Types: 1 Glasses of wine per week   Comment: OCCAS     Social History   Substance and Sexual Activity  Drug Use Yes  . Types: Marijuana, Methamphetamines   Comment: not since she pregnant    Social History   Socioeconomic History  . Marital status: Divorced    Spouse name: Not on file  . Number of children: Not on file  . Years of education: Not on file  . Highest education level: Not on file  Occupational History  . Not on file  Tobacco Use  . Smoking status: Never Smoker  . Smokeless tobacco: Never Used  Vaping Use  . Vaping Use: Never used  Substance and Sexual Activity  . Alcohol use: Yes    Alcohol/week: 1.0 standard drink    Types: 1 Glasses of wine per week    Comment: OCCAS  . Drug use: Yes    Types: Marijuana, Methamphetamines    Comment: not since she pregnant  . Sexual activity: Never    Birth control/protection: Surgical  Other Topics Concern  . Not on file  Social History Narrative  . Not on file   Social Determinants of Health   Financial Resource  Strain:   . Difficulty of Paying Living Expenses: Not on file  Food Insecurity:   . Worried About Programme researcher, broadcasting/film/video in the Last Year: Not on file  . Ran Out of Food in the Last Year: Not on file  Transportation Needs:   . Lack of Transportation (Medical): Not on file  . Lack of Transportation (Non-Medical): Not on file  Physical Activity:   . Days of Exercise per Week: Not on file  . Minutes of Exercise per Session: Not on file  Stress:   . Feeling of Stress : Not on file  Social Connections:    . Frequency of Communication with Friends and Family: Not on file  . Frequency of Social Gatherings with Friends and Family: Not on file  . Attends Religious Services: Not on file  . Active Member of Clubs or Organizations: Not on file  . Attends Banker Meetings: Not on file  . Marital Status: Not on file    Hospital Course:  Patient admitted to inpatient unit for depression and acute psychosis secondary to methamphetamine use. She completed acute detox without complications, and psychosis resolved with sobriety. She was restarted on Zoloft for anxiety and depression, and this was tirated to 100 mg daily. She was also given Trazodone for insomnia, and this was increased to 200 mg nightly. She utilized hydroxyzine PRN and coping skills for anxiety while in the hospital. She particiapted in groups, and interacted well with peers. She was also treated for a UTI on admission. Initially, she requested inpatient substance abuse treatment. However, she later opted for discharge and outpatient follow-up at [facility]. She was also given the number to OBGYN clinic to follow-up for possible vaginal prolapse. She denied suicidal ideations, homicidal ideations, visual hallucinations, and auditory hallucinations. At time of discharge patient and treatment team felt she was safe to discharge home with close outpatient follow-up.   Physical Findings: AIMS:  , ,  ,  ,    CIWA:    COWS:     Musculoskeletal: Strength & Muscle Tone: within normal limits Gait & Station: normal Patient leans: N/A  Psychiatric Specialty Exam: Physical Exam Vitals and nursing note reviewed.  Constitutional:      Appearance: Normal appearance.  HENT:     Head: Normocephalic and atraumatic.     Right Ear: External ear normal.     Left Ear: External ear normal.     Nose: Nose normal.     Mouth/Throat:     Mouth: Mucous membranes are moist.     Pharynx: Oropharynx is clear.  Eyes:     Extraocular Movements:  Extraocular movements intact.     Conjunctiva/sclera: Conjunctivae normal.     Pupils: Pupils are equal, round, and reactive to light.  Cardiovascular:     Rate and Rhythm: Normal rate.     Pulses: Normal pulses.  Pulmonary:     Effort: Pulmonary effort is normal.     Breath sounds: Normal breath sounds.  Abdominal:     General: Abdomen is flat.     Palpations: Abdomen is soft.  Musculoskeletal:        General: No swelling. Normal range of motion.     Cervical back: Normal range of motion and neck supple.  Skin:    General: Skin is warm and dry.  Neurological:     General: No focal deficit present.     Mental Status: She is alert and oriented to person, place, and time.  Psychiatric:  Mood and Affect: Mood normal.        Behavior: Behavior normal.        Thought Content: Thought content normal.        Judgment: Judgment normal.     Review of Systems  Constitutional: Negative for appetite change and fatigue.  HENT: Negative for rhinorrhea and sore throat.   Eyes: Negative for photophobia and visual disturbance.  Respiratory: Negative for cough and shortness of breath.   Cardiovascular: Negative for chest pain and palpitations.  Gastrointestinal: Negative for constipation, diarrhea, nausea and vomiting.  Endocrine: Negative for cold intolerance and heat intolerance.  Genitourinary: Negative for difficulty urinating and dysuria.  Musculoskeletal: Negative for arthralgias and back pain.  Skin: Negative for rash and wound.  Allergic/Immunologic: Negative for food allergies and immunocompromised state.  Neurological: Negative for dizziness and headaches.  Hematological: Negative for adenopathy. Does not bruise/bleed easily.  Psychiatric/Behavioral: Negative for dysphoric mood, hallucinations and suicidal ideas. The patient is not nervous/anxious.   Blood pressure 97/63, pulse (!) 59, temperature 98.2 F (36.8 C), temperature source Oral, resp. rate 18, height 5\' 1"  (1.549  m), weight 50.8 kg, last menstrual period 03/27/2020, SpO2 99 %, currently breastfeeding.Body mass index is 21.16 kg/m.  General Appearance: Well Groomed  03/29/2020::  Good  Speech:  Clear and Coherent  Volume:  Normal  Mood:  Euthymic  Affect:  Congruent  Thought Process:  Coherent and Linear  Orientation:  Full (Time, Place, and Person)  Thought Content:  Logical  Suicidal Thoughts:  No  Homicidal Thoughts:  No  Memory:  Immediate;   Good Recent;   Good Remote;   Good  Judgement:  Fair  Insight:  Good  Psychomotor Activity:  Normal  Concentration:  Good  Recall:  Good  Fund of Knowledge:Good  Language: Good  Akathisia:  Negative  Handed:  Right  AIMS (if indicated):     Assets:  Communication Skills Desire for Improvement Physical Health Resilience Social Support  Sleep:  Number of Hours: 8  Cognition: WNL  ADL's:  Intact        Have you used any form of tobacco in the last 30 days? (Cigarettes, Smokeless Tobacco, Cigars, and/or Pipes): No  Has this patient used any form of tobacco in the last 30 days? (Cigarettes, Smokeless Tobacco, Cigars, and/or Pipes)  No  Blood Alcohol level:  Lab Results  Component Value Date   ETH <10 03/28/2020    Metabolic Disorder Labs:  No results found for: HGBA1C, MPG No results found for: PROLACTIN No results found for: CHOL, TRIG, HDL, CHOLHDL, VLDL, LDLCALC  See Psychiatric Specialty Exam and Suicide Risk Assessment completed by Attending Physician prior to discharge.  Discharge destination:  Home  Is patient on multiple antipsychotic therapies at discharge:  No   Has Patient had three or more failed trials of antipsychotic monotherapy by history:  No  Recommended Plan for Multiple Antipsychotic Therapies: NA  Discharge Instructions    Diet general   Complete by: As directed    Increase activity slowly   Complete by: As directed      Allergies as of 04/07/2020      Reactions   Bactrim  [sulfamethoxazole-trimethoprim] Hives      Medication List    TAKE these medications     Indication  hydrOXYzine 50 MG tablet Commonly known as: ATARAX/VISTARIL Take 1 tablet (50 mg total) by mouth 3 (three) times daily as needed for anxiety.  Indication: Feeling Anxious   sertraline 100  MG tablet Commonly known as: ZOLOFT Take 1 tablet (100 mg total) by mouth daily.  Indication: Major Depressive Disorder   traZODone 100 MG tablet Commonly known as: DESYREL Take 2 tablets (200 mg total) by mouth at bedtime as needed for sleep.  Indication: Trouble Sleeping       Follow-up Information    [facility] Follow up.   Specialty: [facility] information: [address] [place] [address] [phone]       [facility] Follow up.   Specialty: Addiction Medicine Contact information: [address] [place] [address] [phone]        [facility] Follow up.   Contact information: [address] [place] [address] [phone]        [facility] Follow up.   Contact information: [address] [place], [place] [address] Office: [phone] Fax: [phone]       [facility] Follow up on 04/09/2020.   Why: Your appointment is scheduled for Friday, 04/09/20 at 7:45 with Lorella Nimrod, Peer Support Specialist.  Contact information: [address] [place] [address] [phone]               Follow-up recommendations:  Activity:  as tolearted Diet:  regular diet  Comments:  Patient received 7-day supply of free medications along with left over lotions for shoulder. 30-day prescriptions with 1 refill sent directly to [facility] on [address] here in [place] per patient request. She was provided with a number for local OBGYN to schedule appointment for possible vaginal prolapse. Follow-up with [facility] as above.   Signed: Jesse Sans, MD 04/07/2020, 10:31 AM

## 2020-04-07 NOTE — Progress Notes (Signed)
Pt discharged at 1500, left with safe transport to home. Pt is calm, cooperative, denies suicidal and homicidal ideation, denies hallucinations, denies feelings of depression and anxiety. No distress noted, none reported. Pt given discharge instruction, including her follow up appointments, her 30 day prescription was sent electronically to her pharmacy, and pt was given her 7 day supply of meds. All personal belongings returned to pt upon discharge. Pt voices no complaints upon discharge.

## 2020-04-07 NOTE — [facility] Suicide Risk Assessment (Signed)
[facility] Discharge Suicide Risk Assessment   Principal Problem: Major depressive disorder, recurrent episode, mild (HCC) Discharge Diagnoses: Principal Problem:   Major depressive disorder, recurrent episode, mild (HCC) Active Problems:   Anxiety state   Methamphetamine abuse (HCC)   Total Time spent with patient: 30 minutes  Musculoskeletal: Strength & Muscle Tone: within normal limits Gait & Station: normal Patient leans: N/A  Psychiatric Specialty Exam: Review of Systems  Constitutional: Negative for appetite change and fatigue.  HENT: Negative for rhinorrhea and sore throat.   Eyes: Negative for photophobia and visual disturbance.  Respiratory: Negative for cough and shortness of breath.   Cardiovascular: Negative for chest pain and palpitations.  Gastrointestinal: Negative for constipation, diarrhea, nausea and vomiting.  Endocrine: Negative for cold intolerance and heat intolerance.  Genitourinary: Negative for difficulty urinating and dysuria.  Musculoskeletal: Negative for arthralgias and back pain.  Skin: Negative for rash and wound.  Allergic/Immunologic: Negative for food allergies and immunocompromised state.  Neurological: Negative for dizziness and headaches.  Hematological: Negative for adenopathy. Does not bruise/bleed easily.  Psychiatric/Behavioral: Negative for dysphoric mood, hallucinations and suicidal ideas. The patient is not nervous/anxious.     Blood pressure 97/63, pulse (!) 59, temperature 98.2 F (36.8 C), temperature source Oral, resp. rate 18, height 5\' 1"  (1.549 m), weight 50.8 kg, last menstrual period 03/27/2020, SpO2 99 %, currently breastfeeding.Body mass index is 21.16 kg/m.  General Appearance: Well Groomed  03/29/2020::  Good  Speech:  Clear and Coherent409  Volume:  Normal  Mood:  Euthymic  Affect:  Congruent  Thought Process:  Coherent and Linear  Orientation:  Full (Time, Place, and Person)  Thought Content:  Logical  Suicidal  Thoughts:  No  Homicidal Thoughts:  No  Memory:  Immediate;   Good Recent;   Good Remote;   Good  Judgement:  Fair  Insight:  Good  Psychomotor Activity:  Normal  Concentration:  Good  Recall:  Good  Fund of Knowledge:Good  Language: Good  Akathisia:  Negative  Handed:  Right  AIMS (if indicated):     Assets:  Communication Skills Desire for Improvement Physical Health Resilience Social Support  Sleep:  Number of Hours: 8  Cognition: WNL  ADL's:  Intact   Mental Status Per Nursing Assessment::   On Admission:  NA  Demographic Factors:  Caucasian and Unemployed  Loss Factors: NA  Historical Factors: Impulsivity  Risk Reduction Factors:   Responsible for children under 68 years of age, Sense of responsibility to family, Positive social support, Positive therapeutic relationship and Positive coping skills or problem solving skills  Continued Clinical Symptoms:  Depression:   Recent sense of peace/wellbeing  Cognitive Features That Contribute To Risk:  None    Suicide Risk:  Minimal: No identifiable suicidal ideation.  Patients presenting with no risk factors but with morbid ruminations; may be classified as minimal risk based on the severity of the depressive symptoms   Follow-up Information    [facility] Follow up.   Specialty: [facility] information: [address] [place] [phone]       [facility] Follow up.   Specialty: Addiction Medicine Contact information: [address] [place] [phone]        [facility] Follow up.   Contact information: [address] [place] [phone]        [facility] Follow up.   Contact information: [address] [place], [place] [address] Office: [phone] Fax: [phone]       [facility] Follow up on 04/09/2020.   Why: Your appointment  is scheduled for Friday, 04/09/20 at 7:45 with Lorella Nimrod, Peer Support Specialist.  Contact information: [address] [place] [address] [phone]               Plan Of Care/Follow-up recommendations:  Activity:  as tolerated Diet:  regular diet  Jesse Sans, MD 04/07/2020, 10:28 AM

## 2020-04-07 NOTE — BHH Counselor (Signed)
CSW provided the pt with contact information for OBGYN with [facility].  Penni Homans, MSW, LCSW 04/07/2020 12:58 PM

## 2020-04-07 NOTE — Progress Notes (Signed)
Recreation Therapy Notes   Date: 04/07/2020  Time: 9:30 am   Location: Craft room     Behavioral response: N/A   Intervention Topic: Problem Solving    Discussion/Intervention: Patient did not attend group.   Clinical Observations/Feedback:  Patient did not attend group.   Jamaar Howes LRT/CTRS        Mykeria Garman 04/07/2020 12:00 PM

## 2020-04-07 NOTE — Progress Notes (Signed)
Recreation Therapy Notes  INPATIENT RECREATION TR PLAN  Patient Details Name: Brenda Cohen MRN: [id number] DOB: 10-26-1985 Today's Date: 04/07/2020  Rec Therapy Plan Is patient appropriate for Therapeutic Recreation?: Yes Treatment times per week: at least 3 Estimated Length of Stay: 5-7 days TR Treatment/Interventions: Group participation (Comment)  Discharge Criteria Pt will be discharged from therapy if:: Discharged Treatment plan/goals/alternatives discussed and agreed upon by:: Patient/family  Discharge Summary Short term goals set: Patient will identify 3 social activities in an alcohol/drug, free-environment within 5 recreation therapy group sessions Short term goals met: Not met Progress toward goals comments: Groups attended Which groups?: Other (Comment) (Self-care) Reason goals not met: Patient spent most of her time in her room. Therapeutic equipment acquired: N/A Reason patient discharged from therapy: Discharge from hospital Pt/family agrees with progress & goals achieved: Yes Date patient discharged from therapy: 04/07/20   Sara Keys 04/07/2020, 3:26 PM

## 2020-08-29 ENCOUNTER — Other Ambulatory Visit: Payer: Self-pay

## 2020-08-29 ENCOUNTER — Encounter: Payer: Self-pay | Admitting: Emergency Medicine

## 2020-08-29 ENCOUNTER — Emergency Department: Payer: Self-pay

## 2020-08-29 ENCOUNTER — Emergency Department
Admission: EM | Admit: 2020-08-29 | Discharge: 2020-08-29 | Disposition: A | Discharge status: Home / Self Care | Payer: Self-pay | Attending: Emergency Medicine | Admitting: Emergency Medicine

## 2020-08-29 DIAGNOSIS — M2540 Effusion, unspecified joint: Secondary | ICD-10-CM

## 2020-08-29 DIAGNOSIS — M7989 Other specified soft tissue disorders: Secondary | ICD-10-CM | POA: Insufficient documentation

## 2020-08-29 DIAGNOSIS — Z79899 Other long term (current) drug therapy: Secondary | ICD-10-CM | POA: Insufficient documentation

## 2020-08-29 DIAGNOSIS — N760 Acute vaginitis: Secondary | ICD-10-CM | POA: Insufficient documentation

## 2020-08-29 DIAGNOSIS — N390 Urinary tract infection, site not specified: Secondary | ICD-10-CM | POA: Insufficient documentation

## 2020-08-29 DIAGNOSIS — M25471 Effusion, right ankle: Secondary | ICD-10-CM | POA: Insufficient documentation

## 2020-08-29 DIAGNOSIS — M25461 Effusion, right knee: Secondary | ICD-10-CM | POA: Insufficient documentation

## 2020-08-29 DIAGNOSIS — B9689 Other specified bacterial agents as the cause of diseases classified elsewhere: Secondary | ICD-10-CM | POA: Insufficient documentation

## 2020-08-29 LAB — LACTIC ACID, PLASMA
Lactic Acid, Venous: 2.5 mmol/L (ref 0.5–1.9)
Lactic Acid, Venous: 1.9 mmol/L (ref 0.5–1.9)

## 2020-08-29 LAB — SEDIMENTATION RATE: Sed Rate: 7 mm/hr (ref 0–20)

## 2020-08-29 LAB — CBC WITH DIFFERENTIAL/PLATELET
Abs Immature Granulocytes: 0.03 10*3/uL (ref 0.00–0.07)
Basophils Absolute: 0.1 10*3/uL (ref 0.0–0.1)
Basophils Relative: 1 %
Eosinophils Absolute: 0.3 10*3/uL (ref 0.0–0.5)
Eosinophils Relative: 2 %
HCT: 40.8 % (ref 36.0–46.0)
Hemoglobin: 13.4 g/dL (ref 12.0–15.0)
Immature Granulocytes: 0 %
Lymphocytes Relative: 21 %
Lymphs Abs: 2.4 10*3/uL (ref 0.7–4.0)
MCH: 29.6 pg (ref 26.0–34.0)
MCHC: 32.8 g/dL (ref 30.0–36.0)
MCV: 90.3 fL (ref 80.0–100.0)
Monocytes Absolute: 0.6 10*3/uL (ref 0.1–1.0)
Monocytes Relative: 5 %
Neutro Abs: 8 10*3/uL — ABNORMAL HIGH (ref 1.7–7.7)
Neutrophils Relative %: 71 %
Platelets: 328 10*3/uL (ref 150–400)
RBC: 4.52 MIL/uL (ref 3.87–5.11)
RDW: 13.6 % (ref 11.5–15.5)
WBC: 11.3 10*3/uL — ABNORMAL HIGH (ref 4.0–10.5)
nRBC: 0 % (ref 0.0–0.2)

## 2020-08-29 LAB — COMPREHENSIVE METABOLIC PANEL
ALT: 16 U/L (ref 0–44)
AST: 26 U/L (ref 15–41)
Albumin: 4.3 g/dL (ref 3.5–5.0)
Alkaline Phosphatase: 53 U/L (ref 38–126)
Anion gap: 9 (ref 5–15)
BUN: 19 mg/dL (ref 6–20)
CO2: 23 mmol/L (ref 22–32)
Calcium: 8.9 mg/dL (ref 8.9–10.3)
Chloride: 105 mmol/L (ref 98–111)
Creatinine, Ser: 0.84 mg/dL (ref 0.44–1.00)
GFR, Estimated: >60 mL/min (ref >60)
Glucose, Bld: 147 mg/dL — ABNORMAL HIGH (ref 70–99)
Potassium: 3.9 mmol/L (ref 3.5–5.1)
Sodium: 137 mmol/L (ref 135–145)
Total Bilirubin: 0.7 mg/dL (ref 0.3–1.2)
Total Protein: 7.5 g/dL (ref 6.5–8.1)

## 2020-08-29 LAB — WET PREP, GENITAL
Sperm: NONE SEEN
Trich, Wet Prep: NONE SEEN
Yeast Wet Prep HPF POC: NONE SEEN

## 2020-08-29 LAB — URINALYSIS, COMPLETE (UACMP) WITH MICROSCOPIC
Bilirubin Urine: NEGATIVE
Glucose, UA: NEGATIVE mg/dL
Hgb urine dipstick: NEGATIVE
Ketones, ur: 5 mg/dL — AB
Nitrite: NEGATIVE
Protein, ur: NEGATIVE mg/dL
Specific Gravity, Urine: 1.015 (ref 1.005–1.030)
pH: 6 (ref 5.0–8.0)

## 2020-08-29 LAB — CHLAMYDIA/NGC RT PCR (ARMC ONLY)
Chlamydia Tr: NOT DETECTED
N gonorrhoeae: NOT DETECTED

## 2020-08-29 LAB — POC URINE PREG, ED: Preg Test, Ur: NEGATIVE

## 2020-08-29 MED ORDER — IBUPROFEN 800 MG PO TABS: 800.0000 mg | ORAL_TABLET | Freq: Three times a day (TID) | ORAL | 0 refills | Status: AC | PRN

## 2020-08-29 MED ORDER — METRONIDAZOLE 500 MG PO TABS: 500.0000 mg | ORAL_TABLET | Freq: Two times a day (BID) | ORAL | 0 refills | Status: AC

## 2020-08-29 MED ORDER — CEPHALEXIN 500 MG PO CAPS: 500.0000 mg | ORAL_CAPSULE | Freq: Two times a day (BID) | ORAL | 0 refills | Status: AC

## 2020-08-29 MED ORDER — SODIUM CHLORIDE 0.9 % IV BOLUS
1000.0000 mL | Freq: Once | INTRAVENOUS | Status: AC
  Administered 2020-08-29: 1000 mL via INTRAVENOUS

## 2020-08-29 NOTE — Discharge Instructions (Addendum)
Take the antibiotics to help with the UTI and the Flagyl to help with the bacterial vaginosis.  Do not drink alcohol while on this.  Take the ibuprofen to help with the joints.  Please call the number attached to discuss with rheumatology about your joint swelling.  Please return to ER if you develop fevers

## 2020-08-29 NOTE — ED Notes (Signed)
Phlebotomy called regarding blood cultures d/t pt be difficult stick and needing phlebotomy for prior blood draw. Lab reports it is a "bad night" for them and there may be a delay in sending someone down but they will work on it. Provider notified.

## 2020-08-29 NOTE — ED Notes (Addendum)
2nd lactic drawn by phlebotomy - Dr. Fuller Plan notified it was drawn prior to pt receiving IV fluids.

## 2020-08-29 NOTE — ED Triage Notes (Signed)
Pt to ED via POV, reports generalized infection at this time. Pt states hx of IV drug abuse, last use yesterday and it was "just a little bit". Pt states used approx 10grams. Pt c/o pain to R index finger, R knee and R ankle.

## 2020-08-29 NOTE — ED Notes (Signed)
Phlebotomy at bedside.

## 2020-08-29 NOTE — ED Provider Notes (Signed)
[facility] Emergency Department Provider Note  ____________________________________________   Event Date/Time   First MD Initiated Contact with Patient 08/29/20 1646     (approximate)  I have reviewed the triage vital signs and the nursing notes.   HISTORY  Chief Complaint Pain    HPI Kinisha OLAYINKA GATHERS is a 35 y.o. female with history of IV drug use who comes in for swollen joints.  Patient reports having some swelling her joints have been going on for the past 3 weeks.  She states that it is in her right ankle her right knee and her right index finger.  This is been constant, nothing makes it better, nothing makes it worse.  Denies having this previously.  Denies any falls.  Patient states that she does inject IV drugs but states that she only does it above her waist.  She denies any fevers.          Past Medical History:  Diagnosis Date  . Anxiety   . Depression     Patient Active Problem List   Diagnosis Date Noted  . Methamphetamine abuse ([place]) 03/29/2020  . Substance induced mood disorder ([place]) 03/28/2020  . Ruptured left tubal ectopic pregnancy causing hemoperitoneum 11/08/2017  . MDD (major depressive disorder), recurrent episode, mild ([place]) 01/08/2015  . Anxiety state 01/08/2015  . Major depressive disorder, recurrent episode, mild ([place]) 12/17/2014    Past Surgical History:  Procedure Laterality Date  . DILATION AND EVACUATION N/A 11/08/2017   Procedure: DILATATION AND EVACUATION;  Surgeon: Gae Dry, MD;  Location: [facility] ORS;  Service: Gynecology;  Laterality: N/A;  . LAPAROSCOPIC BILATERAL SALPINGECTOMY N/A 11/08/2017   Procedure: LAPAROSCOPIC BILATERAL SALPINGECTOMY;  Surgeon: Gae Dry, MD;  Location: [facility] ORS;  Service: Gynecology;  Laterality: N/A;  . ORIF CLAVICULAR FRACTURE Left 12/05/2018   Procedure: OPEN REDUCTION INTERNAL FIXATION OF PROXIMAL CLAVICLE FRACTURE;  Surgeon: Corky Mull, MD;  Location: [facility] OR[facility];   Service: Orthopedics;  Laterality: Left;  . TUBAL LIGATION Bilateral 04/19/2015   Procedure: POST PARTUM TUBAL LIGATION;  Surgeon: Aletha Halim, MD;  Location: [facility] OR[facility];  Service: Gynecology;  Laterality: Bilateral;  . WISDOM TOOTH EXTRACTION      Prior to Admission medications   Medication Sig Start Date End Date Taking? Authorizing Provider  hydrOXYzine (ATARAX/VISTARIL) 50 MG tablet Take 1 tablet (50 mg total) by mouth 3 (three) times daily as needed for anxiety. 04/07/20   Salley Scarlet, MD  sertraline (ZOLOFT) 100 MG tablet Take 1 tablet (100 mg total) by mouth daily. 04/07/20   Salley Scarlet, MD  traZODone (DESYREL) 100 MG tablet Take 2 tablets (200 mg total) by mouth at bedtime as needed for sleep. 04/07/20   Salley Scarlet, MD    Allergies Bactrim [sulfamethoxazole-trimethoprim]  Family History  Problem Relation Age of Onset  . Drug abuse Mother   . Hyperlipidemia Father   . Hypertension Father   . Depression Father   . Migraines Sister   . Bipolar disorder Paternal Uncle   . Schizophrenia Paternal Uncle   . OCD Paternal Uncle     Social History Social History   Tobacco Use  . Smoking status: Never Smoker  . Smokeless tobacco: Never Used  Vaping Use  . Vaping Use: Never used  Substance Use Topics  . Alcohol use: Yes    Alcohol/week: 1.0 standard drink    Types: 1 Glasses of wine per week    Comment: OCCAS  . Drug use:  Yes    Types: Marijuana, Methamphetamines    Comment: not since she pregnant      Review of Systems Constitutional: No fever/chills Eyes: No visual changes. ENT: No sore throat. Cardiovascular: Denies chest pain. Respiratory: Denies shortness of breath. Gastrointestinal: No abdominal pain.  No nausea, no vomiting.  No diarrhea.  No constipation. Genitourinary: Negative for dysuria. Musculoskeletal: Negative for back pain.  Swollen joints Skin: Negative for rash. Neurological: Negative for headaches, focal weakness or  numbness. All other ROS negative ____________________________________________   PHYSICAL EXAM:  VITAL SIGNS: ED Triage Vitals  Enc Vitals Group     BP 08/29/20 1544 111/82     Pulse Rate 08/29/20 1544 90     Resp 08/29/20 1544 18     Temp 08/29/20 1544 98.9 F (37.2 C)     Temp Source 08/29/20 1544 Oral     SpO2 08/29/20 1544 99 %     Weight 08/29/20 1531 120 lb (54.4 kg)     Height 08/29/20 1531 5' 2" (1.575 m)     Head Circumference --      Peak Flow --      Pain Score 08/29/20 1531 10     Pain Loc --      Pain Edu? --      Excl. in [place]? --     Constitutional: Alert and oriented. Well appearing and in no acute distress. Eyes: Conjunctivae are normal. EOMI. Head: Atraumatic. Nose: No congestion/rhinnorhea. Mouth/Throat: Mucous membranes are moist.   Neck: No stridor. Trachea Midline. FROM Cardiovascular: Normal rate, regular rhythm. Grossly normal heart sounds.  Good peripheral circulation. Respiratory: Normal respiratory effort.  No retractions. Lungs CTAB. Gastrointestinal: Soft and nontender. No distention. No abdominal bruits.  Musculoskeletal: Small amount of swelling noted in the right knee and right ankle and the index finger on the right but full range of motion without any warmth or erythema is noted with good distal pulses throughout Neurologic:  Normal speech and language. No gross focal neurologic deficits are appreciated.  Skin:  Skin is warm, dry and intact. No rash noted. Psychiatric: Mood and affect are normal. Speech and behavior are normal. GU: Deferred   ____________________________________________   LABS (all labs ordered are listed, but only abnormal results are displayed)  Labs Reviewed  CBC WITH DIFFERENTIAL/PLATELET - Abnormal; Notable for the following components:      Result Value   WBC 11.3 (*)    Neutro Abs 8.0 (*)    All other components within normal limits  COMPREHENSIVE METABOLIC PANEL - Abnormal; Notable for the following  components:   Glucose, Bld 147 (*)    All other components within normal limits  LACTIC ACID, PLASMA - Abnormal; Notable for the following components:   Lactic Acid, Venous 2.5 (*)    All other components within normal limits  LACTIC ACID, PLASMA   ____________________________________________   ED ECG REPORT I, Vanessa Louise, the attending physician, personally viewed and interpreted this ECG.  Normal sinus rate 69, no ST elevation, no T wave inversions, sinus arrhythmia ____________________________________________  RADIOLOGY Robert Bellow, personally viewed and evaluated these images (plain radiographs) as part of my medical decision making, as well as reviewing the written report by the radiologist.  ED MD interpretation: Soft tissue swelling  Official radiology report(s): DG Ankle Complete Right  Result Date: 08/29/2020 CLINICAL DATA:  Swelling. Infection. History of IV drug use. EXAM: RIGHT ANKLE - COMPLETE 3+ VIEW COMPARISON:  None. FINDINGS: There is no evidence  of fracture, dislocation, or joint effusion. There is no evidence of arthropathy or other focal bone abnormality. Mild soft tissue edema about the medial lower leg. No soft tissue air or radiopaque foreign body. IMPRESSION: Mild soft tissue edema about the medial lower leg. No osseous abnormality. Electronically Signed   By: Keith Rake M.D.   On: 08/29/2020 17:28   US Venous Img Lower Unilateral Right  Result Date: 08/29/2020 CLINICAL DATA:  Right lower extremity swelling for 3 weeks. EXAM: Right LOWER EXTREMITY VENOUS DOPPLER ULTRASOUND TECHNIQUE: Gray-scale sonography with compression, as well as color and duplex ultrasound, were performed to evaluate the deep venous system(s) from the level of the common femoral vein through the popliteal and proximal calf veins. COMPARISON:  None. FINDINGS: VENOUS Normal compressibility of the common femoral, superficial femoral, and popliteal veins, as well as the visualized  calf veins. Visualized portions of profunda femoral vein and great saphenous vein unremarkable. No filling defects to suggest DVT on grayscale or color Doppler imaging. Doppler waveforms show normal direction of venous flow, normal respiratory plasticity and response to augmentation. Limited views of the contralateral common femoral vein are unremarkable. OTHER None. Limitations: none IMPRESSION: Negative. Electronically Signed   By: Kerby Moors M.D.   On: 08/29/2020 17:49   DG Knee Complete 4 Views Right  Result Date: 08/29/2020 CLINICAL DATA:  Swelling. Infection. History of IV drug use. EXAM: RIGHT KNEE - COMPLETE 4+ VIEW COMPARISON:  None. FINDINGS: No evidence of fracture, dislocation, or joint effusion. No evidence of arthropathy or other focal bone abnormality. Mild anterior lateral soft tissue edema. No soft tissue air or radiopaque foreign body. IMPRESSION: Mild anterior lateral soft tissue edema. No osseous abnormality. Electronically Signed   By: Keith Rake M.D.   On: 08/29/2020 17:29   DG Finger Index Right  Result Date: 08/29/2020 CLINICAL DATA:  Swelling.  Infection.  History of IV drug use. EXAM: RIGHT INDEX FINGER 2+V COMPARISON:  None. FINDINGS: There is no evidence of fracture or dislocation. There is no evidence of arthropathy or other focal bone abnormality. No erosion, periosteal reaction, or bone destruction. Mild generalized soft tissue edema. No soft tissue air or radiopaque foreign body. IMPRESSION: Mild generalized soft tissue edema. No osseous abnormality. Electronically Signed   By: Keith Rake M.D.   On: 08/29/2020 17:27    ____________________________________________   PROCEDURES  Procedure(s) performed (including Critical Care):  Procedures   ____________________________________________   INITIAL IMPRESSION / ASSESSMENT AND PLAN / ED COURSE  Dellia ZAMANTHA STREBEL was evaluated in Emergency Department on 08/29/2020 for the symptoms described in the  history of present illness. She was evaluated in the context of the global COVID-19 pandemic, which necessitated consideration that the patient might be at risk for infection with the SARS-CoV-2 virus that causes COVID-19. Institutional protocols and algorithms that pertain to the evaluation of patients at risk for COVID-19 are in a state of rapid change based on information released by regulatory bodies including the CDC and federal and state organizations. These policies and algorithms were followed during the patient's care in the ED.     Patient is a 35 year old who comes in with some mild swelling in some of her joints and stiffness.  None of the joints looked septic.  There is no redness or erythema to suggest cellulitis.  She is afebrile I have low suspicion for endocarditis.  Patient later reports some vaginal discharge and a little bit of urinary symptoms.  Her urine is consistent with UTI so  we will start Keflex and send urine culture.  Her gonorrhea and Chlamydia are negative and her wet prep is positive for clue cells therefore will treat with for bacterial vaginosis.  I considered the possibility of endocarditis but she is afebrile.  We attempted to get blood cultures on her but difficult to get sticks. Able to get one blood culture.  Her ESR came back normal which makes endocarditis way less likely at this time patient has normal vital signs, very well-appearing and would like to go home and understands that if her blood culture does grow anything that we would give her a call.  On the percent sure what is causing the swelling in the joints it could be a reactive arthritis but will give patient rheumatology follow-up for further work-up.  She understands to come back to the ER if she develops fevers or any other concerns.    To note her left shoulder lactate was elevated but upon repeat without even any of the fluids it came back down to normal so again I have very low suspicion for active  infection        ____________________________________________   FINAL CLINICAL IMPRESSION(S) / ED DIAGNOSES   Final diagnoses:  Urinary tract infection without hematuria, site unspecified  Bacterial vaginosis  Joint swelling      MEDICATIONS GIVEN DURING THIS VISIT:  Medications  sodium chloride 0.9 % bolus 1,000 mL (0 mLs Intravenous Stopped 08/29/20 2049)     ED Discharge Orders         Ordered    cephALEXin (KEFLEX) 500 MG capsule  2 times daily        08/29/20 2322    metroNIDAZOLE (FLAGYL) 500 MG tablet  2 times daily        08/29/20 2323    ibuprofen (ADVIL) 800 MG tablet  Every 8 hours PRN        08/29/20 2323           Note:  This document was prepared using Dragon voice recognition software and may include unintentional dictation errors.   Vanessa Center, MD 08/29/20 [phone]

## 2020-08-29 NOTE — ED Notes (Signed)
Phlebotomy unable to obtain 2nd set of cultures, attempted x2

## 2020-08-29 NOTE — ED Notes (Signed)
Phlebotomy reports able to collect 1 blood culture at this time, states they will be able to come back in 1 hour to collect second d/t needing to draw it from same arm d/t IV placed on left side. Provider aware.

## 2020-09-01 LAB — URINE CULTURE: Culture: >=100000 — AB

## 2020-09-03 LAB — CULTURE, BLOOD (ROUTINE X 2): Culture: NO GROWTH
